# Patient Record
Sex: Male | Born: 1965 | Hispanic: No | Marital: Married | Smoking: Never smoker
Health system: Southern US, Community
[De-identification: ages and names within clinical notes are randomized; demographics above are authoritative.]

## PROBLEM LIST (undated history)

## (undated) DIAGNOSIS — E119 Type 2 diabetes mellitus without complications: Secondary | ICD-10-CM

## (undated) DIAGNOSIS — E785 Hyperlipidemia, unspecified: Secondary | ICD-10-CM

## (undated) HISTORY — DX: Type 2 diabetes mellitus without complications: E11.9

## (undated) HISTORY — DX: Hyperlipidemia, unspecified: E78.5

## (undated) HISTORY — PX: CHOLECYSTECTOMY: SHX55

---

## 1999-09-15 ENCOUNTER — Encounter: Admission: RE | Admit: 1999-09-15 | Discharge: 1999-09-15 | Payer: Self-pay | Admitting: Family Medicine

## 1999-09-23 ENCOUNTER — Encounter: Admission: RE | Admit: 1999-09-23 | Discharge: 1999-09-23 | Payer: Self-pay | Admitting: Sports Medicine

## 2003-10-10 ENCOUNTER — Ambulatory Visit (HOSPITAL_COMMUNITY): Admission: RE | Admit: 2003-10-10 | Discharge: 2003-10-10 | Payer: Self-pay | Admitting: Gastroenterology

## 2003-12-25 ENCOUNTER — Ambulatory Visit (HOSPITAL_COMMUNITY): Admission: RE | Admit: 2003-12-25 | Discharge: 2003-12-25 | Payer: Self-pay | Admitting: Gastroenterology

## 2013-12-27 ENCOUNTER — Ambulatory Visit (INDEPENDENT_AMBULATORY_CARE_PROVIDER_SITE_OTHER): Payer: BC Managed Care – PPO | Admitting: Family Medicine

## 2013-12-27 ENCOUNTER — Ambulatory Visit: Payer: BC Managed Care – PPO

## 2013-12-27 VITALS — BP 134/84 | HR 85 | Temp 98.7°F | Resp 16 | Ht 67.0 in | Wt 195.0 lb

## 2013-12-27 DIAGNOSIS — S2341XA Sprain of ribs, initial encounter: Secondary | ICD-10-CM

## 2013-12-27 DIAGNOSIS — S299XXA Unspecified injury of thorax, initial encounter: Secondary | ICD-10-CM

## 2013-12-27 MED ORDER — MELOXICAM 15 MG PO TABS: 15.0000 mg | ORAL_TABLET | Freq: Every day | ORAL | Status: DC

## 2013-12-27 NOTE — Progress Notes (Signed)
   Subjective:    Patient ID: Isaac Kelly, male    DOB: 12-Nov-1965, 48 y.o.   MRN: [id number]  HPI  Patient usually goes to Urgent Care in [place]. The patient presents tonight complaining of being hit in left posterior ribs with a baseball while up at bat. Immediately following the injury, the patient reports he did not have pain. He developed a bruise, but this did not hurt until today. The pain started when he bent down to place a screw in a piece of wood today. The pain has been off and on today, lasting about 3-4 minutes. Cough causes pain. He is able to resolve pain with change in position. He has not taken any medication for pain.  PMH- elevated cholesterol, takes Crestor, aspirin. Had labs checked a couple of months ago.  PSH-fracture surgery SH-no tobacco, social ETOH, no illicit drugs  Review of Systems No hematuria, no dysuria, no urinary frequency, no fever, no disruption in sleep, no nausea, no vomiting.    Objective:   Physical Exam  Vitals reviewed. Constitutional: He is oriented to person, place, and time. He appears well-developed and well-nourished.  HENT:  Head: Normocephalic and atraumatic.  Eyes: Conjunctivae are normal. Right eye exhibits no discharge. Left eye exhibits no discharge.  Neck: Normal range of motion. Neck supple.  Cardiovascular: Normal rate, regular rhythm and normal heart sounds.   Pulmonary/Chest: Effort normal and breath sounds normal.  Abdominal: Soft. Bowel sounds are normal.  Musculoskeletal: Normal range of motion. He exhibits edema and tenderness.  Left lateral posterior ribs with 6 cm area resolving ecchymosis, slightly swollen, tender to palpation.   Neurological: He is alert and oriented to person, place, and time.  Skin: Skin is warm and dry.  Psychiatric: He has a normal mood and affect. His behavior is normal. Judgment and thought content normal.   Left Rib XRAY- [facility] reading (PRIMARY) by  Dr. Conley Rolls- no rib fracture, no  pneumothorax.      Assessment & Plan:   1. Rib injury - DG Ribs Unilateral W/Chest Left; Future - meloxicam (MOBIC) 15 MG tablet; Take 1 tablet (15 mg total) by mouth daily.  Dispense: 30 tablet; Refill: 0 -provided written and verbal instructions, including Patient Instructions  TAke prescription medication once a day for 7-10, then as needed Can use heat to painful area 2-4 times a day Return if worsening or if no improvement in 7-10 days or if fever, abdominal pain, nausea/vomiting.  Emi Belfast, FNP-BC  [facility], [facility]  12/27/2013 8:23 PM

## 2013-12-27 NOTE — Patient Instructions (Addendum)
TAke prescription medication once a day for 7-10, then as needed Can use heat to painful area 2-4 times a day Return if worsening or if no improvement in 7-10 days or if fever, abdominal pain, nausea/vomiting.  Rib Contusion A rib contusion (bruise) can occur by a blow to the chest or by a fall against a hard object. Usually these will be much better in a couple weeks. If X-rays were taken today and there are no broken bones (fractures), the diagnosis of bruising is made. However, broken ribs may not show up for several days, or may be discovered later on a routine X-ray when signs of healing show up. If this happens to you, it does not mean that something was missed on the X-ray, but simply that it did not show up on the first X-rays. Earlier diagnosis will not usually change the treatment. HOME CARE INSTRUCTIONS   Avoid strenuous activity. Be careful during activities and avoid bumping the injured ribs. Activities that pull on the injured ribs and cause pain should be avoided, if possible.  For the first day or two, an ice pack used every 20 minutes while awake may be helpful. Put ice in a plastic bag and put a towel between the bag and the skin.  Eat a normal, well-balanced diet. Drink plenty of fluids to avoid constipation.  Take deep breaths several times a day to keep lungs free of infection. Try to cough several times a day. Splint the injured area with a pillow while coughing to ease pain. Coughing can help prevent pneumonia.  Wear a rib belt or binder only if told to do so by your caregiver. If you are wearing a rib belt or binder, you must do the breathing exercises as directed by your caregiver. If not used properly, rib belts or binders restrict breathing which can lead to pneumonia.  Only take over-the-counter or prescription medicines for pain, discomfort, or fever as directed by your caregiver. SEEK MEDICAL CARE IF:   You or your child has an oral temperature above 102 F (38.9  C).  Your baby is older than 3 months with a rectal temperature of 100.5 F (38.1 C) or higher for more than 1 day.  You develop a cough, with thick or bloody sputum. SEEK IMMEDIATE MEDICAL CARE IF:   You have difficulty breathing.  You feel sick to your stomach (nausea), have vomiting or belly (abdominal) pain.  You have worsening pain, not controlled with medications, or there is a change in the location of the pain.  You develop sweating or radiation of the pain into the arms, jaw or shoulders, or become light headed or faint.  You or your child has an oral temperature above 102 F (38.9 C), not controlled by medicine.  Your or your baby is older than 3 months with a rectal temperature of 102 F (38.9 C) or higher.  Your baby is 67 months old or younger with a rectal temperature of 100.4 F (38 C) or higher. MAKE SURE YOU:   Understand these instructions.  Will watch your condition.  Will get help right away if you are not doing well or get worse. Document Released: 04/07/2001 Document Revised: 11/07/2012 Document Reviewed: 02/29/2008 [facility] Patient Information 2014 [place], [place].

## 2014-02-26 ENCOUNTER — Encounter: Payer: Self-pay | Admitting: Gastroenterology

## 2014-03-01 ENCOUNTER — Encounter: Payer: Self-pay | Admitting: Gastroenterology

## 2014-06-04 ENCOUNTER — Encounter: Payer: BC Managed Care – PPO | Attending: "Endocrinology | Admitting: *Deleted

## 2014-06-04 ENCOUNTER — Encounter: Payer: Self-pay | Admitting: *Deleted

## 2014-06-04 DIAGNOSIS — R7309 Other abnormal glucose: Secondary | ICD-10-CM | POA: Diagnosis not present

## 2014-06-04 DIAGNOSIS — Z713 Dietary counseling and surveillance: Secondary | ICD-10-CM | POA: Diagnosis not present

## 2014-06-04 DIAGNOSIS — R7303 Prediabetes: Secondary | ICD-10-CM

## 2014-06-04 NOTE — Progress Notes (Signed)
  Medical Nutrition Therapy:  Appt start time: 1630 end time:  1730.   Assessment:  Primary concerns today:  Patient is here with spouse for nutrition counseling pertaining to pre-diabetes.  They are also here with an [place] interpreter, Isaac Kelly, from [employer].  Both individuals have a family history of diabetes. Isaac Kelly states she has made dietary changes: increasing vegetables and lean proteins, and increasing physical activity.  Normal buys the groceries and does the cooking.  She states since she was diagnosed with pre-diabetes, she using leaner cooking methods, but before she fried foods. The family eats out once a week.  They eat at the kitchen table together without distractions.  Isaac Kelly states she is a fast eater, but Isaac Kelly eats a little more slowly.  She states she used to get a lot of [employer] and ate more candies.  They both have made a lot of changes to their diet and report loosing 10 pounds. They is also eating smaller portions  A1c: 5.8%  Preferred Learning Style:   No preference indicated   Learning Readiness:   Change in progress   MEDICATIONS: none   DIETARY INTAKE:  Usual eating pattern includes 3 meals and 0-2 snacks per day.  Everyday foods include starches, lean proteins, fruits and vegetables.  Avoided foods include others.    24-hr recall:  B ( AM): vegetable smoothie and coffee with honey  Snk ( AM): fruit  L ( PM): salad or fast food Snk ( PM): maybe fruit D ( PM): Salad, vegetables, chicken, fish or steak sometimes, nopales, lentils, beans, rice, 2 tortillas sometimes Snk ( PM): bowl of cereal or maybe fruit Beverages: water  Usual physical activity: works [employer]   Estimated energy needs: 2000 calories 225 g carbohydrates 150 g protein 56 g fat  Progress Towards Goal(s):  In progress.   Nutritional Diagnosis:  NB-1.1 Food and nutrition-related knowledge deficit As related to proper balance of fats, carbohydrates, and proteins.  As  evidenced by A1c indicative of pre-diabetes.    Intervention:  Discussed physiology of diabetes.  Discussed genetic predisposition/ethnicity, and history of GDM of increasing risk for diabetes.  Discussed carb counting and reading food labels.  Recommended increasing non-starchy vegetables. Explained more about diabetes test results and symptoms of hypoglycemia  Teaching Method Utilized:  Visual Auditory Hands on  Handouts given during visit include:  Spanish Living Well with Diabtes  Spanish MyPlate  Spanish meal plan card  Barriers to learning/adherence to lifestyle change: none, change in progress  Demonstrated degree of understanding via:  Teach Back   Monitoring/Evaluation:  Dietary intake, exercise, and body weight in 2 month(s).

## 2014-08-06 ENCOUNTER — Ambulatory Visit: Payer: BC Managed Care – PPO | Admitting: *Deleted

## 2019-10-13 ENCOUNTER — Ambulatory Visit: Payer: Self-pay

## 2020-10-13 ENCOUNTER — Inpatient Hospital Stay
Admission: EM | Admit: 2020-10-13 | Discharge: 2020-10-15 | DRG: 282 | Disposition: A | Discharge status: Home / Self Care | Payer: Medicaid Other | Attending: Family Medicine | Admitting: Family Medicine

## 2020-10-13 ENCOUNTER — Emergency Department: Payer: Medicaid Other

## 2020-10-13 ENCOUNTER — Other Ambulatory Visit: Payer: Self-pay

## 2020-10-13 ENCOUNTER — Encounter: Payer: Self-pay | Admitting: Emergency Medicine

## 2020-10-13 DIAGNOSIS — Z791 Long term (current) use of non-steroidal anti-inflammatories (NSAID): Secondary | ICD-10-CM | POA: Diagnosis not present

## 2020-10-13 DIAGNOSIS — Z8249 Family history of ischemic heart disease and other diseases of the circulatory system: Secondary | ICD-10-CM

## 2020-10-13 DIAGNOSIS — Z79899 Other long term (current) drug therapy: Secondary | ICD-10-CM

## 2020-10-13 DIAGNOSIS — R079 Chest pain, unspecified: Secondary | ICD-10-CM | POA: Diagnosis not present

## 2020-10-13 DIAGNOSIS — R030 Elevated blood-pressure reading, without diagnosis of hypertension: Secondary | ICD-10-CM | POA: Diagnosis present

## 2020-10-13 DIAGNOSIS — E1165 Type 2 diabetes mellitus with hyperglycemia: Secondary | ICD-10-CM | POA: Diagnosis present

## 2020-10-13 DIAGNOSIS — R7989 Other specified abnormal findings of blood chemistry: Secondary | ICD-10-CM | POA: Diagnosis not present

## 2020-10-13 DIAGNOSIS — E785 Hyperlipidemia, unspecified: Secondary | ICD-10-CM | POA: Diagnosis not present

## 2020-10-13 DIAGNOSIS — W19XXXA Unspecified fall, initial encounter: Secondary | ICD-10-CM | POA: Diagnosis not present

## 2020-10-13 DIAGNOSIS — I214 Non-ST elevation (NSTEMI) myocardial infarction: Secondary | ICD-10-CM | POA: Diagnosis not present

## 2020-10-13 DIAGNOSIS — Z20822 Contact with and (suspected) exposure to covid-19: Secondary | ICD-10-CM | POA: Diagnosis not present

## 2020-10-13 DIAGNOSIS — R350 Frequency of micturition: Secondary | ICD-10-CM | POA: Diagnosis not present

## 2020-10-13 DIAGNOSIS — L237 Allergic contact dermatitis due to plants, except food: Secondary | ICD-10-CM | POA: Diagnosis present

## 2020-10-13 DIAGNOSIS — T380X5A Adverse effect of glucocorticoids and synthetic analogues, initial encounter: Secondary | ICD-10-CM | POA: Diagnosis not present

## 2020-10-13 DIAGNOSIS — R0789 Other chest pain: Secondary | ICD-10-CM | POA: Diagnosis not present

## 2020-10-13 DIAGNOSIS — Z7982 Long term (current) use of aspirin: Secondary | ICD-10-CM | POA: Diagnosis not present

## 2020-10-13 DIAGNOSIS — I10 Essential (primary) hypertension: Secondary | ICD-10-CM | POA: Diagnosis not present

## 2020-10-13 DIAGNOSIS — Z833 Family history of diabetes mellitus: Secondary | ICD-10-CM

## 2020-10-13 LAB — TROPONIN I (HIGH SENSITIVITY)
Troponin I (High Sensitivity): 311 ng/L (ref <18)
Troponin I (High Sensitivity): 2060 ng/L (ref <18)
Troponin I (High Sensitivity): 8509 ng/L (ref <18)

## 2020-10-13 LAB — PROTIME-INR
INR: 1.1 (ref 0.8–1.2)
Prothrombin Time: 13.4 seconds (ref 11.4–15.2)

## 2020-10-13 LAB — BASIC METABOLIC PANEL
Anion gap: 11 (ref 5–15)
BUN: 17 mg/dL (ref 6–20)
CO2: 23 mmol/L (ref 22–32)
Calcium: 9.2 mg/dL (ref 8.9–10.3)
Chloride: 101 mmol/L (ref 98–111)
Creatinine, Ser: 0.95 mg/dL (ref 0.61–1.24)
GFR, Estimated: >60 mL/min (ref >60)
Glucose, Bld: 142 mg/dL — ABNORMAL HIGH (ref 70–99)
Potassium: 4 mmol/L (ref 3.5–5.1)
Sodium: 135 mmol/L (ref 135–145)

## 2020-10-13 LAB — LIPID PANEL
Cholesterol: 201 mg/dL — ABNORMAL HIGH (ref 0–200)
HDL: 64 mg/dL (ref >40)
LDL Cholesterol: 119 mg/dL — ABNORMAL HIGH (ref 0–99)
Total CHOL/HDL Ratio: 3.1 RATIO
Triglycerides: 90 mg/dL (ref <150)
VLDL: 18 mg/dL (ref 0–40)

## 2020-10-13 LAB — CBC
HCT: 45.2 % (ref 39.0–52.0)
Hemoglobin: 15.3 g/dL (ref 13.0–17.0)
MCH: 29.6 pg (ref 26.0–34.0)
MCHC: 33.8 g/dL (ref 30.0–36.0)
MCV: 87.4 fL (ref 80.0–100.0)
Platelets: 271 10*3/uL (ref 150–400)
RBC: 5.17 MIL/uL (ref 4.22–5.81)
RDW: 12.4 % (ref 11.5–15.5)
WBC: 13 10*3/uL — ABNORMAL HIGH (ref 4.0–10.5)
nRBC: 0 % (ref 0.0–0.2)

## 2020-10-13 LAB — RESP PANEL BY RT-PCR (FLU A&B, COVID) ARPGX2
Influenza A by PCR: NEGATIVE
Influenza B by PCR: NEGATIVE
SARS Coronavirus 2 by RT PCR: NEGATIVE

## 2020-10-13 LAB — HEMOGLOBIN A1C
Hgb A1c MFr Bld: 5.8 % — ABNORMAL HIGH (ref 4.8–5.6)
Mean Plasma Glucose: 119.76 mg/dL

## 2020-10-13 LAB — CBG MONITORING, ED: Glucose-Capillary: 104 mg/dL — ABNORMAL HIGH (ref 70–99)

## 2020-10-13 LAB — APTT: aPTT: 27 seconds (ref 24–36)

## 2020-10-13 MED ORDER — MORPHINE SULFATE (PF) 2 MG/ML IV SOLN: 2.0000 mg | INTRAVENOUS | Status: DC | PRN

## 2020-10-13 MED ORDER — HYDROMORPHONE HCL 1 MG/ML IJ SOLN
0.5000 mg | Freq: Once | INTRAMUSCULAR | Status: AC
Start: 2020-10-13 — End: 2020-10-13
  Administered 2020-10-13: 0.5 mg via INTRAVENOUS
  Filled 2020-10-13: qty 1

## 2020-10-13 MED ORDER — OMEGA-3-ACID ETHYL ESTERS 1 G PO CAPS
1.0000 g | ORAL_CAPSULE | Freq: Every day | ORAL | Status: DC
  Administered 2020-10-15: 1 g via ORAL
  Filled 2020-10-13 (×2): qty 1

## 2020-10-13 MED ORDER — SODIUM CHLORIDE 0.9 % IV SOLN: INTRAVENOUS | Status: DC

## 2020-10-13 MED ORDER — NITROGLYCERIN 0.4 MG SL SUBL
0.4000 mg | SUBLINGUAL_TABLET | SUBLINGUAL | Status: DC | PRN
  Administered 2020-10-13: 0.4 mg via SUBLINGUAL
  Filled 2020-10-13: qty 1

## 2020-10-13 MED ORDER — ASPIRIN 81 MG PO CHEW
324.0000 mg | CHEWABLE_TABLET | Freq: Once | ORAL | Status: AC
  Administered 2020-10-13: 324 mg via ORAL
  Filled 2020-10-13: qty 4

## 2020-10-13 MED ORDER — HEPARIN BOLUS VIA INFUSION
4000.0000 [IU] | Freq: Once | INTRAVENOUS | Status: AC
  Administered 2020-10-13: 4000 [IU] via INTRAVENOUS
  Filled 2020-10-13: qty 4000

## 2020-10-13 MED ORDER — HEPARIN (PORCINE) 25000 UT/250ML-% IV SOLN
1450.0000 [IU]/h | INTRAVENOUS | Status: DC
  Administered 2020-10-13: 1100 [IU]/h via INTRAVENOUS
  Filled 2020-10-13 (×2): qty 250

## 2020-10-13 MED ORDER — IOHEXOL 350 MG/ML SOLN
100.0000 mL | Freq: Once | INTRAVENOUS | Status: AC | PRN
  Administered 2020-10-13: 100 mL via INTRAVENOUS

## 2020-10-13 MED ORDER — ASPIRIN EC 81 MG PO TBEC
81.0000 mg | DELAYED_RELEASE_TABLET | Freq: Every day | ORAL | Status: DC
  Administered 2020-10-15: 81 mg via ORAL
  Filled 2020-10-13 (×2): qty 1

## 2020-10-13 MED ORDER — PRAVASTATIN SODIUM 20 MG PO TABS
20.0000 mg | ORAL_TABLET | Freq: Every evening | ORAL | Status: DC
  Administered 2020-10-13 – 2020-10-14 (×2): 20 mg via ORAL
  Filled 2020-10-13 (×3): qty 1

## 2020-10-13 MED ORDER — INSULIN ASPART 100 UNIT/ML ~~LOC~~ SOLN: 0.0000 [IU] | SUBCUTANEOUS | Status: DC

## 2020-10-13 MED ORDER — METOPROLOL TARTRATE 5 MG/5ML IV SOLN: 2.5000 mg | INTRAVENOUS | Status: DC | PRN

## 2020-10-13 MED ORDER — FENOFIBRATE 160 MG PO TABS
160.0000 mg | ORAL_TABLET | Freq: Every day | ORAL | Status: DC
  Administered 2020-10-13 – 2020-10-15 (×2): 160 mg via ORAL
  Filled 2020-10-13 (×3): qty 1

## 2020-10-13 NOTE — ED Triage Notes (Signed)
Patient presents to the ED with mid-sternal chest pain radiating into his back.  Patient states pain started approx. 30-45 min ago while he was playing baseball, running the bases.  Patient appears uncomfortable in triage.  Patient states he had a similar episode of chest pain approx. 2 weeks ago but did not see a physician.

## 2020-10-13 NOTE — H&P (Signed)
History and Physical  Isaac Kelly LKG:[id number] DOB: 19-Aug-1965 DOA: 10/13/2020  Referring physician: Dr. Katrinka Blazing, EDP PCP: Jeannie Done, MD  Outpatient Specialists: None Patient coming from: Home  Chief Complaint: Chest pain  HPI: Isaac Kelly is a 55 y.o. male with medical history significant for hyperlipidemia, recently diagnosed poison ivy on prednisone, who presented from home with complaints of recurrent severe chest pain.  He describes it as chest tightness on the left side of his chest, radiating to the middle of his back.  First onset was about a week ago.  It woke him out of his sleep, lasted for about 30 minutes then resolved after his wife rubbed his back with "a cream".  This afternoon while he was playing baseball he noted the same chest tightness on the left side of his chest radiating to the middle of his back.  One of his friends gave him some medication on the field to help with the pain but it persisted.  Denies dyspnea.  Admits to fear of taking breaths due to his chest pain.  Denies family history of early coronary artery disease.  EMS was called and he received sublingual nitroglycerin and a full dose of aspirin.  He was brought to the ED for further evaluation.  Work-up in the ED revealed NSTEMI with elevated troponin greater than 2000 and no evidence of acute ischemia on twelve-lead EKG.  Was started on heparin drip.  ED Course:  Afebrile, BP 146/96, pulse 67, respiratory rate 18, O2 saturation 98% on room air.  Lab studies remarkable for troponin greater than 2000 from 311.  Nonfasting LDL 119.  WBC 13.0K, hemoglobin 15.3K, platelet count 271K.  Review of Systems: Review of systems as noted in the HPI. All other systems reviewed and are negative.   Past Medical History:  Diagnosis Date  . Diabetes mellitus without complication (HCC)   . Hyperlipidemia    Past Surgical History:  Procedure Laterality Date  . CHOLECYSTECTOMY      Social History:  reports  that he has never smoked. He has never used smokeless tobacco. No history on file for alcohol use and drug use.   No Known Allergies  Family History  Problem Relation Age of Onset  . Diabetes Other     Monitor with diabetes and hypertension. Father with diabetes and hypertension.  Prior to Admission medications   Medication Sig Start Date End Date Taking? Authorizing Provider  fenofibrate (TRICOR) 145 MG tablet Take 145 mg by mouth every evening.   Yes [provider]  Omega 3 1000 MG CAPS Take 1 capsule by mouth daily.   Yes [provider]  pravastatin (PRAVACHOL) 20 MG tablet Take 20 mg by mouth every evening.   Yes [provider]  predniSONE (DELTASONE) 10 MG tablet Take 10-30 mg by mouth See admin instructions. Take 3 tablets (30mg ) by mouth daily for three days, take 2 tablets (20mg ) by mouth daily for 3 days then take 1 tablet (10mg ) by mouth daily for five days 10/08/20  Yes [provider]    Physical Exam: BP (!) 144/74 (BP Location: Right Arm)   Pulse 71   Temp 98.2 F (36.8 C) (Oral)   Resp 18   Ht 5\' 7"  (1.702 m)   Wt 85.3 kg   SpO2 98%   BMI 29.44 kg/m   . General: 55 y.o. year-old male well developed well nourished in no acute distress.  Alert and oriented x3. . Cardiovascular: Regular rate and rhythm with  no rubs or gallops.  No thyromegaly or JVD noted.  No lower extremity edema. 2/4 pulses in all 4 extremities. Marland Kitchen Respiratory: Clear to auscultation with no wheezes or rales. Good inspiratory effort. . Abdomen: Soft nontender nondistended with normal bowel sounds x4 quadrants. . Muskuloskeletal: No cyanosis, clubbing or edema noted bilaterally . Neuro: CN II-XII intact, strength, sensation, reflexes . Skin: No ulcerative lesions noted or rashes . Psychiatry: Judgement and insight appear normal. Mood is appropriate for condition and setting          Labs on Admission:  Basic Metabolic Panel: Recent Labs  Lab  10/13/20 1628  NA 135  K 4.0  CL 101  CO2 23  GLUCOSE 142*  BUN 17  CREATININE 0.95  CALCIUM 9.2   Liver Function Tests: No results for input(s): AST, ALT, ALKPHOS, BILITOT, PROT, ALBUMIN in the last 168 hours. No results for input(s): LIPASE, AMYLASE in the last 168 hours. No results for input(s): AMMONIA in the last 168 hours. CBC: Recent Labs  Lab 10/13/20 1628  WBC 13.0*  HGB 15.3  HCT 45.2  MCV 87.4  PLT 271   Cardiac Enzymes: No results for input(s): CKTOTAL, CKMB, CKMBINDEX, TROPONINI in the last 168 hours.  BNP (last 3 results) No results for input(s): BNP in the last 8760 hours.  ProBNP (last 3 results) No results for input(s): PROBNP in the last 8760 hours.  CBG: No results for input(s): GLUCAP in the last 168 hours.  Radiological Exams on Admission: DG Chest 2 View  Result Date: 10/13/2020 CLINICAL DATA:  Patient presents to the ED with mid-sternal chest pain radiating into his back. EXAM: CHEST - 2 VIEW COMPARISON:  Chest radiograph 12/27/2013 FINDINGS: The cardiomediastinal contours are within normal limits. The lungs are clear. No pneumothorax or pleural effusion. No acute finding in the visualized skeleton. IMPRESSION: No acute cardiopulmonary process. Electronically Signed   By: Emmaline Kluver M.D.   On: 10/13/2020 17:03   CT Angio Chest/Abd/Pel for Dissection W and/or Wo Contrast  Result Date: 10/13/2020 CLINICAL DATA:  Abdominal pain. Aortic dissection suspected. mid-sternal chest pain radiating into his back. EXAM: CT ANGIOGRAPHY CHEST, ABDOMEN AND PELVIS TECHNIQUE: Non-contrast CT of the chest was initially obtained. Multidetector CT imaging through the chest, abdomen and pelvis was performed using the standard protocol during bolus administration of intravenous contrast. Multiplanar reconstructed images and MIPs were obtained and reviewed to evaluate the vascular anatomy. CONTRAST:  OMNIPAQUE IOHEXOL 350 MG/ML SOLN COMPARISON:  CT abdomen  pelvis 09/19/2019 FINDINGS: CTA CHEST FINDINGS Cardiovascular: Preferential opacification of the thoracic aorta. No evidence of thoracic aortic aneurysm or dissection. Marked eccentric noncalcified atherosclerotic plaque measuring up to 8 mm on sagittal view (9:113) along the distal aortic arch/proximal descending thoracic aorta. Anormal heart size. No significant pericardial effusion. No coronary artery calcifications. The main pulmonary artery is normal in caliber. No central pulmonary embolus. Mediastinum/Nodes: No enlarged mediastinal, hilar, or axillary lymph nodes. Thyroid gland, trachea, and esophagus demonstrate no significant findings. Lungs/Pleura: No focal consolidation. Trace bilateral lower lobe subsegmental atelectasis. No pulmonary nodule. No pulmonary mass. No pleural effusion. No pneumothorax. Musculoskeletal: No chest wall abnormality. No suspicious lytic or blastic osseous lesions. No acute displaced fracture. CTA ABDOMEN AND PELVIS FINDINGS VASCULAR Aorta: Mild atherosclerotic plaque. Normal caliber aorta without aneurysm, dissection, vasculitis or significant stenosis. Celiac: Patent without evidence of aneurysm, dissection, vasculitis or significant stenosis. SMA: Mild atherosclerotic plaque at the origin. Patent without evidence of aneurysm, dissection, vasculitis or significant stenosis. Renals: Mild  atherosclerotic plaque at the origin. Both renal arteries are patent without evidence of aneurysm, dissection, vasculitis, fibromuscular dysplasia or significant stenosis. IMA: Mild atherosclerotic plaque. Patent without evidence of aneurysm, dissection, vasculitis or significant stenosis. Inflow: Mild atherosclerotic plaque. Patent without evidence of aneurysm, dissection, vasculitis or significant stenosis. Veins: No obvious venous abnormality within the limitations of this arterial phase study. NON-VASCULAR Hepatobiliary: No focal liver abnormality. No gallstones, gallbladder wall thickening,  or pericholecystic fluid. No biliary dilatation. Pancreas: No focal lesion. Normal pancreatic contour. No surrounding inflammatory changes. No main pancreatic ductal dilatation. Spleen: Normal in size without focal abnormality. Adrenals/Urinary Tract: No adrenal nodule bilaterally. Bilateral kidneys enhance symmetrically. No hydronephrosis. No hydroureter. The urinary bladder is unremarkable. Stomach/Bowel: Stomach is within normal limits. No evidence of bowel wall thickening or dilatation. Appendix appears normal. Lymphatic: No lymphadenopathy. Reproductive: Prostate is unremarkable. Other: No intraperitoneal free fluid. No intraperitoneal free gas. No organized fluid collection. Musculoskeletal: No abdominal wall hernia or abnormality. No suspicious lytic or blastic osseous lesions. No acute displaced fracture. Review of the MIP images confirms the above findings. IMPRESSION: 1. No aortic dissection or aneurysm. 2. Aortic Atherosclerosis (ICD10-I70.0) with marked eccentric noncalcified atherosclerotic plaque measuring up to 8 mm along the distal aortic arch/proximal descending thoracic aorta. 3. No acute nonvascular intrathoracic, intra-abdominal, intrapelvic abnormality. Electronically Signed   By: Tish FredericksonMorgane  Naveau M.D.   On: 10/13/2020 18:25    EKG: I independently viewed the EKG done and my findings are as followed: Sinus rhythm rate of 86.  Nonspecific ST-T changes.  QTc 432.  Assessment/Plan Present on Admission: . NSTEMI (non-ST elevated myocardial infarction) [facility](HCC)  Active Problems:   NSTEMI (non-ST elevated myocardial infarction) [facility](HCC)  NSTEMI Presented with second episode of severe chest tightness today while playing baseball. First episode of chest tightness was about a week ago.  It woke him out of his sleep.  Lasted 30 minutes then resolved. Elevated troponin on presentation to the ED, up trended to greater than 1000. Received sublingual nitroglycerin and a full dose aspirin Started on  heparin drip in the ED, continue Cardiology consult 2D echo pending. Non fasting LDL elevated greater than [id number], goal less than 70. N.p.o. until seen by cardiology Start gentle IV fluid hydration normal saline at 50 cc/h, DC IV fluid if no plan for heart cath. Repeat twelve-lead EKG Closely monitor on telemetry.  Hyperlipidemia Nonfasting LDL 119 Goal LDL less than 70 Continue home fenofibrate and home Lovaza Resume home pravastatin, defer to cardiology to switch to Lipitor 80 mg daily.  Recently diagnosed poison ivy on prednisone Hold off steroid for now in the setting of NSTEMI  Hyperglycemia: Likely steroid-induced, on prednisone. No prior history of diabetes Obtain hemoglobin A1c Sensitive insulin sliding scale Avoid hypoglycemia.   DVT prophylaxis: Heparin drip.  Code Status: Full code as stated by the patient himself.  Family Communication: His wife is at bedside.  All questions answered to the best of my ability.  Disposition Plan: Admit to progressive cardiac.  Consults called: Cardiology.  Admission status: Inpatient status.  Patient will require at least 2 midnights for further evaluation and treatment of present condition.   Status is: Inpatient    Dispo:  Patient From: Home  Planned Disposition: Home  Anticipated discharge date 10/15/2020 or when cardiology signs off.  Medically stable for discharge: No, ongoing management of chest pain, NSTEMI.         Darlin Droparole N Len Kluver MD [facility] Pager [phone]  If 7PM-7AM, please contact night-coverage [email or web address] Password  [email or web address]  10/13/2020, 7:59 PM

## 2020-10-13 NOTE — Progress Notes (Signed)
ANTICOAGULATION CONSULT NOTE - Initial Consult  Pharmacy Consult for heparin Indication: chest pain/ACS  No Known Allergies  Patient Measurements: Height: 5\' 7"  (170.2 cm) Weight: 85.3 kg (188 lb) IBW/kg (Calculated) : 66.1 HEPARIN DW (KG): 83.4  Vital Signs: Temp: 98.2 F (36.8 C) (03/20 1625) Temp Source: Oral (03/20 1625) BP: 145/78 (03/20 1713) Pulse Rate: 80 (03/20 1713)  Labs: Recent Labs    10/13/20 1628  HGB 15.3  HCT 45.2  PLT 271  CREATININE 0.95  TROPONINIHS 311*    Estimated Creatinine Clearance: 91.7 mL/min (by C-G formula based on SCr of 0.95 mg/dL).   Medical History: Past Medical History:  Diagnosis Date  . Diabetes mellitus without complication (HCC)   . Hyperlipidemia     Assessment: 55 y.o. male presenting with chest pain and found to have elevated troponin, ACS. Pharmacy has been consulted for IV heparin dosing.   Hgb and platelets WNL. No overt bleeding noted. No prior to admission anticoagulation noted. Baseline PT/INR, CBC, and aPTT ordered.  Goal of Therapy:  Heparin level 0.3-0.7 units/ml Monitor platelets by anticoagulation protocol: Yes  Plan:  Heparin bolus 4000 units IV x 1 Then begin heparin infusion at 1100 units/hr Check heparin level 6 hours after start of infusion Monitor CBC, daily heparin level  Continue to monitor for signs/symptoms of bleeding   53, PharmD Clinical Pharmacist  10/13/2020   5:13 PM

## 2020-10-13 NOTE — ED Provider Notes (Signed)
[facility] Emergency Department Provider Note  ____________________________________________   Event Date/Time   First MD Initiated Contact with Patient 10/13/20 1652     (approximate)  I have reviewed the triage vital signs and the nursing notes.   HISTORY  Chief Complaint Chest Pain   HPI Isaac Kelly is a 55 y.o. male with past medical history of HDL and DM who presents for assessment of chest pain.  Patient states he was playing baseball when it began approximately 30 to 45 minutes prior to arrival he was running and felt sudden onset of pain in his left chest rating to his mid back.  He states it is currently 10/6 in intensity.  He states it waxes and wanes.  Describes it as sharp.  Does not radiate to his arms or jaw.  He denies any other sick symptoms including headache, earache, sore throat, nausea, vomiting, diarrhea, dysuria, cough, shortness of breath, abdominal pain, lower back pain, extremity pain, rash or any recent injuries or falls.  Denies being on any blood thinners.  Denies tobacco use, EtOH use on a regular basis or any illicit drug use.  No prior similar episodes.  No clearly fitting aggravating factors.  He has not taken any medications prior to arrival.        Past Medical History:  Diagnosis Date  . Diabetes mellitus without complication (HCC)   . Hyperlipidemia     There are no problems to display for this patient.   Past Surgical History:  Procedure Laterality Date  . CHOLECYSTECTOMY      Prior to Admission medications   Medication Sig Start Date End Date Taking? Authorizing Provider  aspirin 81 MG tablet Take 81 mg by mouth daily.    [provider]  meloxicam (MOBIC) 15 MG tablet Take 1 tablet (15 mg total) by mouth daily. 12/27/13   Emi Belfast, FNP  rosuvastatin (CRESTOR) 20 MG tablet Take 20 mg by mouth daily.    [provider]    Allergies Patient has no known allergies.  Family History   Problem Relation Age of Onset  . Diabetes Other     Social History Social History   Tobacco Use  . Smoking status: Never Smoker  . Smokeless tobacco: Never Used    Review of Systems  Review of Systems  Constitutional: Negative for chills and fever.  HENT: Negative for sore throat.   Eyes: Negative for pain.  Respiratory: Negative for cough and stridor.   Cardiovascular: Positive for chest pain.  Gastrointestinal: Negative for vomiting.  Genitourinary: Negative for dysuria.  Musculoskeletal: Negative for myalgias.  Skin: Negative for rash.  Neurological: Negative for seizures, loss of consciousness and headaches.  Psychiatric/Behavioral: Negative for suicidal ideas.  All other systems reviewed and are negative.     ____________________________________________   PHYSICAL EXAM:  VITAL SIGNS: ED Triage Vitals  Enc Vitals Group     BP --      Pulse --      Resp 10/13/20 1625 18     Temp 10/13/20 1625 98.2 F (36.8 C)     Temp Source 10/13/20 1625 Oral     SpO2 --      Weight 10/13/20 1626 188 lb (85.3 kg)     Height 10/13/20 1626 5\' 7"  (1.702 m)     Head Circumference --      Peak Flow --      Pain Score 10/13/20 1625 5     Pain Loc --  Pain Edu? --      Excl. in GC? --    Vitals:   10/13/20 1625 10/13/20 1713  BP:  (!) 145/78  Pulse:  80  Resp: 18 15  Temp: 98.2 F (36.8 C)   SpO2:  97%   Physical Exam Vitals and nursing note reviewed.  Constitutional:      Appearance: He is well-developed.  HENT:     Head: Normocephalic and atraumatic.     Right Ear: External ear normal.     Left Ear: External ear normal.     Nose: Nose normal.  Eyes:     Conjunctiva/sclera: Conjunctivae normal.  Cardiovascular:     Rate and Rhythm: Normal rate and regular rhythm.     Heart sounds: No murmur heard.   Pulmonary:     Effort: Pulmonary effort is normal. No respiratory distress.     Breath sounds: Normal breath sounds.  Abdominal:     Palpations:  Abdomen is soft.     Tenderness: There is no abdominal tenderness.  Musculoskeletal:     Cervical back: Neck supple.  Skin:    General: Skin is warm and dry.     Capillary Refill: Capillary refill takes less than 2 seconds.  Neurological:     Mental Status: He is alert and oriented to person, place, and time.  Psychiatric:        Mood and Affect: Mood normal.      ____________________________________________   LABS (all labs ordered are listed, but only abnormal results are displayed)  Labs Reviewed  BASIC METABOLIC PANEL - Abnormal; Notable for the following components:      Result Value   Glucose, Bld 142 (*)    All other components within normal limits  CBC - Abnormal; Notable for the following components:   WBC 13.0 (*)    All other components within normal limits  TROPONIN I (HIGH SENSITIVITY) - Abnormal; Notable for the following components:   Troponin I (High Sensitivity) 311 (*)    All other components within normal limits  RESP PANEL BY RT-PCR (FLU A&B, COVID) ARPGX2  HEMOGLOBIN A1C  LIPID PANEL  TROPONIN I (HIGH SENSITIVITY)   ____________________________________________  EKG  Sinus rhythm with ventricular 75, normal axis, unremarkable intervals and possible hyperacute T waves in V2 without other clear evidence of acute ischemia or other significant underlying arrhythmia. ____________________________________________  RADIOLOGY  ED MD interpretation: Chest x-ray is unremarkable.  CTA dissection study shows no evidence of dissection or other clear acute intrathoracic process.  There is evidence of aortic atherosclerosis.  Official radiology report(s): DG Chest 2 View  Result Date: 10/13/2020 CLINICAL DATA:  Patient presents to the ED with mid-sternal chest pain radiating into his back. EXAM: CHEST - 2 VIEW COMPARISON:  Chest radiograph 12/27/2013 FINDINGS: The cardiomediastinal contours are within normal limits. The lungs are clear. No pneumothorax or pleural  effusion. No acute finding in the visualized skeleton. IMPRESSION: No acute cardiopulmonary process. Electronically Signed   By: Emmaline KluverNancy  Ballantyne M.D.   On: 10/13/2020 17:03   CT Angio Chest/Abd/Pel for Dissection W and/or Wo Contrast  Result Date: 10/13/2020 CLINICAL DATA:  Abdominal pain. Aortic dissection suspected. mid-sternal chest pain radiating into his back. EXAM: CT ANGIOGRAPHY CHEST, ABDOMEN AND PELVIS TECHNIQUE: Non-contrast CT of the chest was initially obtained. Multidetector CT imaging through the chest, abdomen and pelvis was performed using the standard protocol during bolus administration of intravenous contrast. Multiplanar reconstructed images and MIPs were obtained and reviewed to evaluate the  vascular anatomy. CONTRAST:  OMNIPAQUE IOHEXOL 350 MG/ML SOLN COMPARISON:  CT abdomen pelvis 09/19/2019 FINDINGS: CTA CHEST FINDINGS Cardiovascular: Preferential opacification of the thoracic aorta. No evidence of thoracic aortic aneurysm or dissection. Marked eccentric noncalcified atherosclerotic plaque measuring up to 8 mm on sagittal view (9:113) along the distal aortic arch/proximal descending thoracic aorta. Anormal heart size. No significant pericardial effusion. No coronary artery calcifications. The main pulmonary artery is normal in caliber. No central pulmonary embolus. Mediastinum/Nodes: No enlarged mediastinal, hilar, or axillary lymph nodes. Thyroid gland, trachea, and esophagus demonstrate no significant findings. Lungs/Pleura: No focal consolidation. Trace bilateral lower lobe subsegmental atelectasis. No pulmonary nodule. No pulmonary mass. No pleural effusion. No pneumothorax. Musculoskeletal: No chest wall abnormality. No suspicious lytic or blastic osseous lesions. No acute displaced fracture. CTA ABDOMEN AND PELVIS FINDINGS VASCULAR Aorta: Mild atherosclerotic plaque. Normal caliber aorta without aneurysm, dissection, vasculitis or significant stenosis. Celiac: Patent without  evidence of aneurysm, dissection, vasculitis or significant stenosis. SMA: Mild atherosclerotic plaque at the origin. Patent without evidence of aneurysm, dissection, vasculitis or significant stenosis. Renals: Mild atherosclerotic plaque at the origin. Both renal arteries are patent without evidence of aneurysm, dissection, vasculitis, fibromuscular dysplasia or significant stenosis. IMA: Mild atherosclerotic plaque. Patent without evidence of aneurysm, dissection, vasculitis or significant stenosis. Inflow: Mild atherosclerotic plaque. Patent without evidence of aneurysm, dissection, vasculitis or significant stenosis. Veins: No obvious venous abnormality within the limitations of this arterial phase study. NON-VASCULAR Hepatobiliary: No focal liver abnormality. No gallstones, gallbladder wall thickening, or pericholecystic fluid. No biliary dilatation. Pancreas: No focal lesion. Normal pancreatic contour. No surrounding inflammatory changes. No main pancreatic ductal dilatation. Spleen: Normal in size without focal abnormality. Adrenals/Urinary Tract: No adrenal nodule bilaterally. Bilateral kidneys enhance symmetrically. No hydronephrosis. No hydroureter. The urinary bladder is unremarkable. Stomach/Bowel: Stomach is within normal limits. No evidence of bowel wall thickening or dilatation. Appendix appears normal. Lymphatic: No lymphadenopathy. Reproductive: Prostate is unremarkable. Other: No intraperitoneal free fluid. No intraperitoneal free gas. No organized fluid collection. Musculoskeletal: No abdominal wall hernia or abnormality. No suspicious lytic or blastic osseous lesions. No acute displaced fracture. Review of the MIP images confirms the above findings. IMPRESSION: 1. No aortic dissection or aneurysm. 2. Aortic Atherosclerosis (ICD10-I70.0) with marked eccentric noncalcified atherosclerotic plaque measuring up to 8 mm along the distal aortic arch/proximal descending thoracic aorta. 3. No acute  nonvascular intrathoracic, intra-abdominal, intrapelvic abnormality. Electronically Signed   By: Tish Frederickson M.D.   On: 10/13/2020 18:25    ____________________________________________   PROCEDURES  Procedure(s) performed (including Critical Care):  .Critical Care Performed by: Gilles Chiquito, MD Authorized by: Gilles Chiquito, MD   Critical care provider statement:    Critical care time (minutes):  45   Critical care time was exclusive of:  Separately billable procedures and treating other patients   Critical care was necessary to treat or prevent imminent or life-threatening deterioration of the following conditions:  Cardiac failure   Critical care was time spent personally by me on the following activities:  Discussions with consultants, evaluation of patient's response to treatment, examination of patient, ordering and performing treatments and interventions, ordering and review of laboratory studies, ordering and review of radiographic studies, pulse oximetry, re-evaluation of patient's condition, obtaining history from patient or surrogate and review of old charts     ____________________________________________   INITIAL IMPRESSION / ASSESSMENT AND PLAN / ED COURSE      Patient presents with above-stated history exam for assessment of some chest pain that  began while he was running playing baseball within an hour of arrival.  Denies any injuries or falls.  On arrival he is afebrile hemodynamically stable.  Prior differential includes ACS, MSK, dissection, spontaneous pneumothorax, pneumonia, PE, and GI etiologies.  ECG has no clear ischemia although given age and risk factors we will plan to obtain delta troponin.  CBC remarkable for WBC count of 13 and no acute anemia or abnormal platelets.  BMP shows glucose of 142 but no other significant derangements.  Chest x-ray obtained has no evidence of pneumonia or pneumothorax.  Given overall description of symptoms with  onset during exertion concern for dissection.  CTA obtained does not show evidence of dissection pericardial effusion large PE or other clear acute thoracic process.  Initial troponin is elevated to 11 and given concern for possible hyperacute T waves in V2 concerning for NSTEMI.  Patient given ASA and started on heparin.  I will plan to admit to hospital service for further evaluation and management.  He is pain-free on several reassessments.    ____________________________________________   FINAL CLINICAL IMPRESSION(S) / ED DIAGNOSES  Final diagnoses:  NSTEMI (non-ST elevated myocardial infarction) (HCC)    Medications  nitroGLYCERIN (NITROSTAT) SL tablet 0.4 mg (0.4 mg Sublingual Given 10/13/20 1839)  insulin aspart (novoLOG) injection 0-15 Units (has no administration in time range)  HYDROmorphone (DILAUDID) injection 0.5 mg (0.5 mg Intravenous Given 10/13/20 1711)  iohexol (OMNIPAQUE) 350 MG/ML injection 100 mL (100 mLs Intravenous Contrast Given 10/13/20 1743)  aspirin chewable tablet 324 mg (324 mg Oral Given 10/13/20 1839)     ED Discharge Orders    None       Note:  This document was prepared using Dragon voice recognition software and may include unintentional dictation errors.   Gilles Chiquito, MD 10/13/20 [phone]

## 2020-10-14 ENCOUNTER — Inpatient Hospital Stay
Admit: 2020-10-14 | Discharge: 2020-10-14 | Disposition: A | Discharge status: Home / Self Care | Payer: Medicaid Other | Attending: Internal Medicine | Admitting: Internal Medicine

## 2020-10-14 ENCOUNTER — Other Ambulatory Visit: Payer: Self-pay

## 2020-10-14 ENCOUNTER — Encounter
Admission: EM | Disposition: A | Discharge status: Home / Self Care | Payer: Self-pay | Source: Home / Self Care | Attending: Family Medicine

## 2020-10-14 DIAGNOSIS — I214 Non-ST elevation (NSTEMI) myocardial infarction: Secondary | ICD-10-CM

## 2020-10-14 HISTORY — PX: LEFT HEART CATH AND CORONARY ANGIOGRAPHY: CATH118249

## 2020-10-14 LAB — CBG MONITORING, ED
Glucose-Capillary: 104 mg/dL — ABNORMAL HIGH (ref 70–99)
Glucose-Capillary: 97 mg/dL (ref 70–99)
Glucose-Capillary: 96 mg/dL (ref 70–99)

## 2020-10-14 LAB — ECHOCARDIOGRAM COMPLETE
AR max vel: 2.22 cm2
AV Area VTI: 2.01 cm2
AV Area mean vel: 2.25 cm2
AV Mean grad: 5 mmHg
AV Peak grad: 9.4 mmHg
Ao pk vel: 1.53 m/s
Area-P 1/2: 4.17 cm2
Height: 67 in
MV VTI: 2.04 cm2
S' Lateral: 2.5 cm
Weight: 3008 oz

## 2020-10-14 LAB — CBC
HCT: 44.3 % (ref 39.0–52.0)
Hemoglobin: 14.9 g/dL (ref 13.0–17.0)
MCH: 29.6 pg (ref 26.0–34.0)
MCHC: 33.6 g/dL (ref 30.0–36.0)
MCV: 87.9 fL (ref 80.0–100.0)
Platelets: 240 10*3/uL (ref 150–400)
RBC: 5.04 MIL/uL (ref 4.22–5.81)
RDW: 12.6 % (ref 11.5–15.5)
WBC: 12.8 10*3/uL — ABNORMAL HIGH (ref 4.0–10.5)
nRBC: 0 % (ref 0.0–0.2)

## 2020-10-14 LAB — BASIC METABOLIC PANEL
Anion gap: 7 (ref 5–15)
BUN: 11 mg/dL (ref 6–20)
CO2: 25 mmol/L (ref 22–32)
Calcium: 8.7 mg/dL — ABNORMAL LOW (ref 8.9–10.3)
Chloride: 105 mmol/L (ref 98–111)
Creatinine, Ser: 0.78 mg/dL (ref 0.61–1.24)
GFR, Estimated: >60 mL/min (ref >60)
Glucose, Bld: 104 mg/dL — ABNORMAL HIGH (ref 70–99)
Potassium: 3.6 mmol/L (ref 3.5–5.1)
Sodium: 137 mmol/L (ref 135–145)

## 2020-10-14 LAB — BRAIN NATRIURETIC PEPTIDE: B Natriuretic Peptide: 100.5 pg/mL — ABNORMAL HIGH (ref 0.0–100.0)

## 2020-10-14 LAB — GLUCOSE, CAPILLARY
Glucose-Capillary: 132 mg/dL — ABNORMAL HIGH (ref 70–99)
Glucose-Capillary: 133 mg/dL — ABNORMAL HIGH (ref 70–99)

## 2020-10-14 LAB — HEPARIN LEVEL (UNFRACTIONATED)
Heparin Unfractionated: 0.22 IU/mL — ABNORMAL LOW (ref 0.30–0.70)
Heparin Unfractionated: 0.24 IU/mL — ABNORMAL LOW (ref 0.30–0.70)

## 2020-10-14 LAB — PHOSPHORUS: Phosphorus: 3.6 mg/dL (ref 2.5–4.6)

## 2020-10-14 LAB — MAGNESIUM: Magnesium: 2.3 mg/dL (ref 1.7–2.4)

## 2020-10-14 LAB — TROPONIN I (HIGH SENSITIVITY): Troponin I (High Sensitivity): 11658 ng/L (ref <18)

## 2020-10-14 SURGERY — LEFT HEART CATH AND CORONARY ANGIOGRAPHY
Anesthesia: Moderate Sedation

## 2020-10-14 MED ORDER — HEPARIN SODIUM (PORCINE) 1000 UNIT/ML IJ SOLN
INTRAMUSCULAR | Status: AC
  Filled 2020-10-14: qty 1

## 2020-10-14 MED ORDER — VERAPAMIL HCL 2.5 MG/ML IV SOLN
INTRAVENOUS | Status: AC
  Filled 2020-10-14: qty 2

## 2020-10-14 MED ORDER — SODIUM CHLORIDE 0.9 % WEIGHT BASED INFUSION: 3.0000 mL/kg/h | INTRAVENOUS | Status: DC

## 2020-10-14 MED ORDER — ASPIRIN 81 MG PO CHEW: 81.0000 mg | CHEWABLE_TABLET | ORAL | Status: AC

## 2020-10-14 MED ORDER — MIDAZOLAM HCL 2 MG/2ML IJ SOLN
INTRAMUSCULAR | Status: AC
  Filled 2020-10-14: qty 2

## 2020-10-14 MED ORDER — ONDANSETRON HCL 4 MG/2ML IJ SOLN: 4.0000 mg | Freq: Four times a day (QID) | INTRAMUSCULAR | Status: DC | PRN

## 2020-10-14 MED ORDER — IOHEXOL 300 MG/ML  SOLN
INTRAMUSCULAR | Status: DC | PRN
  Administered 2020-10-14: 83 mL

## 2020-10-14 MED ORDER — ATORVASTATIN CALCIUM 80 MG PO TABS: 80.0000 mg | ORAL_TABLET | Freq: Every day | ORAL | Status: DC

## 2020-10-14 MED ORDER — INSULIN ASPART 100 UNIT/ML ~~LOC~~ SOLN: 0.0000 [IU] | Freq: Three times a day (TID) | SUBCUTANEOUS | Status: DC

## 2020-10-14 MED ORDER — SODIUM CHLORIDE 0.9% FLUSH
3.0000 mL | Freq: Two times a day (BID) | INTRAVENOUS | Status: DC
  Administered 2020-10-14: 3 mL via INTRAVENOUS

## 2020-10-14 MED ORDER — FENTANYL CITRATE (PF) 100 MCG/2ML IJ SOLN
INTRAMUSCULAR | Status: DC | PRN
  Administered 2020-10-14: 25 ug via INTRAVENOUS

## 2020-10-14 MED ORDER — SODIUM CHLORIDE 0.9 % WEIGHT BASED INFUSION: 1.0000 mL/kg/h | INTRAVENOUS | Status: DC

## 2020-10-14 MED ORDER — SODIUM CHLORIDE 0.9 % WEIGHT BASED INFUSION
1.0000 mL/kg/h | INTRAVENOUS | Status: AC
  Administered 2020-10-14: 1 mL/kg/h via INTRAVENOUS

## 2020-10-14 MED ORDER — LABETALOL HCL 5 MG/ML IV SOLN: 10.0000 mg | INTRAVENOUS | Status: AC | PRN

## 2020-10-14 MED ORDER — HEPARIN (PORCINE) IN NACL 1000-0.9 UT/500ML-% IV SOLN
INTRAVENOUS | Status: AC
  Filled 2020-10-14: qty 1000

## 2020-10-14 MED ORDER — FENTANYL CITRATE (PF) 100 MCG/2ML IJ SOLN
INTRAMUSCULAR | Status: AC
  Filled 2020-10-14: qty 2

## 2020-10-14 MED ORDER — SODIUM CHLORIDE 0.9 % IV SOLN: 250.0000 mL | INTRAVENOUS | Status: DC | PRN

## 2020-10-14 MED ORDER — ASPIRIN 81 MG PO CHEW
CHEWABLE_TABLET | ORAL | Status: AC
  Administered 2020-10-14: 81 mg via ORAL
  Filled 2020-10-14: qty 1

## 2020-10-14 MED ORDER — HEPARIN BOLUS VIA INFUSION
1250.0000 [IU] | Freq: Once | INTRAVENOUS | Status: AC
  Administered 2020-10-14: 1250 [IU] via INTRAVENOUS
  Filled 2020-10-14: qty 1250

## 2020-10-14 MED ORDER — MIDAZOLAM HCL 2 MG/2ML IJ SOLN
INTRAMUSCULAR | Status: DC | PRN
  Administered 2020-10-14: 1 mg via INTRAVENOUS

## 2020-10-14 MED ORDER — SODIUM CHLORIDE 0.9% FLUSH: 3.0000 mL | INTRAVENOUS | Status: DC | PRN

## 2020-10-14 MED ORDER — PREDNISONE 10 MG PO TABS
10.0000 mg | ORAL_TABLET | Freq: Every day | ORAL | Status: DC
  Administered 2020-10-15: 10 mg via ORAL
  Filled 2020-10-14: qty 1

## 2020-10-14 MED ORDER — ACETAMINOPHEN 325 MG PO TABS: 650.0000 mg | ORAL_TABLET | ORAL | Status: DC | PRN

## 2020-10-14 MED ORDER — HYDRALAZINE HCL 20 MG/ML IJ SOLN: 10.0000 mg | INTRAMUSCULAR | Status: AC | PRN

## 2020-10-14 MED ORDER — VERAPAMIL HCL 2.5 MG/ML IV SOLN
INTRAVENOUS | Status: DC | PRN
  Administered 2020-10-14: 2.5 mg via INTRA_ARTERIAL

## 2020-10-14 MED ORDER — HEPARIN SODIUM (PORCINE) 1000 UNIT/ML IJ SOLN
INTRAMUSCULAR | Status: DC | PRN
  Administered 2020-10-14: 4000 [IU] via INTRAVENOUS

## 2020-10-14 MED ORDER — HEPARIN (PORCINE) IN NACL 2000-0.9 UNIT/L-% IV SOLN
INTRAVENOUS | Status: DC | PRN
  Administered 2020-10-14: 1000 mL

## 2020-10-14 SURGICAL SUPPLY — 11 items
CATH INFINITI 5 FR JL3.5 (CATHETERS) ×1 IMPLANT
CATH INFINITI JR4 5F (CATHETERS) ×1 IMPLANT
DEVICE RAD TR BAND REGULAR (VASCULAR PRODUCTS) ×1 IMPLANT
DRAPE BRACHIAL (DRAPES) ×1 IMPLANT
GLIDESHEATH SLEND SS 6F .021 (SHEATH) ×1 IMPLANT
GUIDEWIRE INQWIRE 1.5J.035X260 (WIRE) IMPLANT
INQWIRE 1.5J .035X260CM (WIRE) ×2
PACK CARDIAC CATH (CUSTOM PROCEDURE TRAY) ×2 IMPLANT
PROTECTION STATION PRESSURIZED (MISCELLANEOUS) ×2
SET ATX SIMPLICITY (MISCELLANEOUS) ×1 IMPLANT
STATION PROTECTION PRESSURIZED (MISCELLANEOUS) IMPLANT

## 2020-10-14 NOTE — ED Notes (Signed)
Report given to Brianna, RN

## 2020-10-14 NOTE — Consult Note (Signed)
[facility] Cardiology Consultation Note  Patient ID: Isaac Kelly, MRN: [id number], DOB/AGE: [birth year] 55 y.o. Admit date: 10/13/2020   Date of Consult: 10/14/2020 Primary Physician: Jeannie Done, MD Primary Cardiologist: None  Chief Complaint:  Chief Complaint  Patient presents with  . Chest Pain   Reason for Consult: Non-ST elevation myocardial infarction  HPI: 55 y.o. male with known hyperlipidemia and borderline hypertension with family history of cardiovascular disease previously on antilipid medication management.  He ended up having an episode of severe substernal chest discomfort when doing activities on Sundays.  This had lasted for several hours when he came to the hospital.  At that time his EKG had shown that he had normal sinus rhythm.  Chest x-ray was normal but elevated troponin of 311.  Now troponin has increased to 11 650 and consistent with non-ST elevation myocardial infarction.  He was placed on heparin and aspirin and has had no evidence of chest discomfort or other symptoms since that time.  The patient additionally has been hemodynamically stable  Past Medical History:  Diagnosis Date  . Diabetes mellitus without complication (HCC)   . Hyperlipidemia       Surgical History:  Past Surgical History:  Procedure Laterality Date  . CHOLECYSTECTOMY       Home Meds: Prior to Admission medications   Medication Sig Start Date End Date Taking? Authorizing Provider  fenofibrate (TRICOR) 145 MG tablet Take 145 mg by mouth every evening.   Yes [provider]  Omega 3 1000 MG CAPS Take 1 capsule by mouth daily.   Yes [provider]  pravastatin (PRAVACHOL) 20 MG tablet Take 20 mg by mouth every evening.   Yes [provider]  predniSONE (DELTASONE) 10 MG tablet Take 10-30 mg by mouth See admin instructions. Take 3 tablets (30mg ) by mouth daily for three days, take 2 tablets (20mg ) by mouth daily for 3 days then take 1 tablet (10mg )  by mouth daily for five days 10/08/20  Yes [provider]    Inpatient Medications:  . aspirin EC  81 mg Oral Daily  . fenofibrate  160 mg Oral Daily  . insulin aspart  0-15 Units Subcutaneous Q4H  . omega-3 acid ethyl esters  1 g Oral Daily  . pravastatin  20 mg Oral QPM   . sodium chloride 50 mL/hr at 10/13/20 2055  . heparin 1,250 Units/hr (10/14/20 0358)    Allergies: No Known Allergies  Social History   Socioeconomic History  . Marital status: Married    Spouse name: Not on file  . Number of children: Not on file  . Years of education: Not on file  . Highest education level: Not on file  Occupational History  . Not on file  Tobacco Use  . Smoking status: Never Smoker  . Smokeless tobacco: Never Used  Substance and Sexual Activity  . Alcohol use: Not on file  . Drug use: Not on file  . Sexual activity: Not on file  Other Topics Concern  . Not on file  Social History Narrative  . Not on file   Social Determinants of Health   Financial Resource Strain: Not on file  Food Insecurity: Not on file  Transportation Needs: Not on file  Physical Activity: Not on file  Stress: Not on file  Social Connections: Not on file  Intimate Partner Violence: Not on file     Family History  Problem Relation Age of Onset  . Diabetes Other  Review of Systems Positive for chest pain Negative for: General:  chills, fever, night sweats or weight changes.  Cardiovascular: PND orthopnea syncope dizziness  Dermatological skin lesions rashes Respiratory: Cough congestion Urologic: Frequent urination urination at night and hematuria Abdominal: negative for nausea, vomiting, diarrhea, bright red blood per rectum, melena, or hematemesis Neurologic: negative for visual changes, and/or hearing changes  All other systems reviewed and are otherwise negative except as noted above.  Labs: No results for input(s): CKTOTAL, CKMB, TROPONINI in the last 72 hours. Lab Results   Component Value Date   WBC 12.8 (H) 10/14/2020   HGB 14.9 10/14/2020   HCT 44.3 10/14/2020   MCV 87.9 10/14/2020   PLT 240 10/14/2020    Recent Labs  Lab 10/14/20 0356  NA 137  K 3.6  CL 105  CO2 25  BUN 11  CREATININE 0.78  CALCIUM 8.7*  GLUCOSE 104*   Lab Results  Component Value Date   CHOL 201 (H) 10/13/2020   HDL 64 10/13/2020   LDLCALC 119 (H) 10/13/2020   TRIG 90 10/13/2020   No results found for: DDIMER  Radiology/Studies:  DG Chest 2 View  Result Date: 10/13/2020 CLINICAL DATA:  Patient presents to the ED with mid-sternal chest pain radiating into his back. EXAM: CHEST - 2 VIEW COMPARISON:  Chest radiograph 12/27/2013 FINDINGS: The cardiomediastinal contours are within normal limits. The lungs are clear. No pneumothorax or pleural effusion. No acute finding in the visualized skeleton. IMPRESSION: No acute cardiopulmonary process. Electronically Signed   By: Emmaline Kluver M.D.   On: 10/13/2020 17:03   CT Angio Chest/Abd/Pel for Dissection W and/or Wo Contrast  Result Date: 10/13/2020 CLINICAL DATA:  Abdominal pain. Aortic dissection suspected. mid-sternal chest pain radiating into his back. EXAM: CT ANGIOGRAPHY CHEST, ABDOMEN AND PELVIS TECHNIQUE: Non-contrast CT of the chest was initially obtained. Multidetector CT imaging through the chest, abdomen and pelvis was performed using the standard protocol during bolus administration of intravenous contrast. Multiplanar reconstructed images and MIPs were obtained and reviewed to evaluate the vascular anatomy. CONTRAST:  OMNIPAQUE IOHEXOL 350 MG/ML SOLN COMPARISON:  CT abdomen pelvis 09/19/2019 FINDINGS: CTA CHEST FINDINGS Cardiovascular: Preferential opacification of the thoracic aorta. No evidence of thoracic aortic aneurysm or dissection. Marked eccentric noncalcified atherosclerotic plaque measuring up to 8 mm on sagittal view (9:113) along the distal aortic arch/proximal descending thoracic aorta. Anormal heart  size. No significant pericardial effusion. No coronary artery calcifications. The main pulmonary artery is normal in caliber. No central pulmonary embolus. Mediastinum/Nodes: No enlarged mediastinal, hilar, or axillary lymph nodes. Thyroid gland, trachea, and esophagus demonstrate no significant findings. Lungs/Pleura: No focal consolidation. Trace bilateral lower lobe subsegmental atelectasis. No pulmonary nodule. No pulmonary mass. No pleural effusion. No pneumothorax. Musculoskeletal: No chest wall abnormality. No suspicious lytic or blastic osseous lesions. No acute displaced fracture. CTA ABDOMEN AND PELVIS FINDINGS VASCULAR Aorta: Mild atherosclerotic plaque. Normal caliber aorta without aneurysm, dissection, vasculitis or significant stenosis. Celiac: Patent without evidence of aneurysm, dissection, vasculitis or significant stenosis. SMA: Mild atherosclerotic plaque at the origin. Patent without evidence of aneurysm, dissection, vasculitis or significant stenosis. Renals: Mild atherosclerotic plaque at the origin. Both renal arteries are patent without evidence of aneurysm, dissection, vasculitis, fibromuscular dysplasia or significant stenosis. IMA: Mild atherosclerotic plaque. Patent without evidence of aneurysm, dissection, vasculitis or significant stenosis. Inflow: Mild atherosclerotic plaque. Patent without evidence of aneurysm, dissection, vasculitis or significant stenosis. Veins: No obvious venous abnormality within the limitations of this arterial phase study. NON-VASCULAR  Hepatobiliary: No focal liver abnormality. No gallstones, gallbladder wall thickening, or pericholecystic fluid. No biliary dilatation. Pancreas: No focal lesion. Normal pancreatic contour. No surrounding inflammatory changes. No main pancreatic ductal dilatation. Spleen: Normal in size without focal abnormality. Adrenals/Urinary Tract: No adrenal nodule bilaterally. Bilateral kidneys enhance symmetrically. No hydronephrosis. No  hydroureter. The urinary bladder is unremarkable. Stomach/Bowel: Stomach is within normal limits. No evidence of bowel wall thickening or dilatation. Appendix appears normal. Lymphatic: No lymphadenopathy. Reproductive: Prostate is unremarkable. Other: No intraperitoneal free fluid. No intraperitoneal free gas. No organized fluid collection. Musculoskeletal: No abdominal wall hernia or abnormality. No suspicious lytic or blastic osseous lesions. No acute displaced fracture. Review of the MIP images confirms the above findings. IMPRESSION: 1. No aortic dissection or aneurysm. 2. Aortic Atherosclerosis (ICD10-I70.0) with marked eccentric noncalcified atherosclerotic plaque measuring up to 8 mm along the distal aortic arch/proximal descending thoracic aorta. 3. No acute nonvascular intrathoracic, intra-abdominal, intrapelvic abnormality. Electronically Signed   By: Tish Frederickson M.D.   On: 10/13/2020 18:25    EKG: Normal sinus rhythm otherwise normal EKG  Weights: Filed Weights   10/13/20 1626  Weight: 85.3 kg     Physical Exam: Blood pressure 116/74, pulse (!) 59, temperature 98.2 F (36.8 C), temperature source Oral, resp. rate 17, height 5\' 7"  (1.702 m), weight 85.3 kg, SpO2 97 %. Body mass index is 29.44 kg/m. General: Well developed, well nourished, in no acute distress. Head eyes ears nose throat: Normocephalic, atraumatic, sclera non-icteric, no xanthomas, nares are without discharge. No apparent thyromegaly and/or mass  Lungs: Normal respiratory effort.  no wheezes, no rales, no rhonchi.  Heart: RRR with normal S1 S2. no murmur gallop, no rub, PMI is normal size and placement, carotid upstroke normal without bruit, jugular venous pressure is normal Abdomen: Soft, non-tender, non-distended with normoactive bowel sounds. No hepatomegaly. No rebound/guarding. No obvious abdominal masses. Abdominal aorta is normal size without bruit Extremities: No edema. no cyanosis, no clubbing, no ulcers   Peripheral : 2+ bilateral upper extremity pulses, 2+ bilateral femoral pulses, 2+ bilateral dorsal pedal pulse Neuro: Alert and oriented. No facial asymmetry. No focal deficit. Moves all extremities spontaneously. Musculoskeletal: Normal muscle tone without kyphosis Psych:  Responds to questions appropriately with a normal affect.    Assessment: 55 year old male with hyperlipidemia family history of cardiovascular disease and hypertension with acute non-ST elevation myocardial infarction without evidence of congestive heart failure  Plan: 1.  Continue heparin and aspirin for further risk reduction of myocardial infarction 2.  Beta-blocker ACE inhibitor as able for myocardial infarction 3.  Echocardiogram for LV systolic dysfunction valvular heart disease and extent of myocardial infarction 4.  Proceed to cardiac catheterization to assess coronary anatomy and further treatment thereof as necessary.  Patient understands the risk and benefits of cardiac catheterization.  This includes the possibility of death stroke heart attack infection bleeding or blood clot.  He is at low risk for conscious sedation  Signed, 52 M.D. [facility] Cardiology 10/14/2020, 6:33 AM

## 2020-10-14 NOTE — Progress Notes (Signed)
ANTICOAGULATION CONSULT NOTE - Initial Consult  Pharmacy Consult for heparin Indication: chest pain/ACS  No Known Allergies  Patient Measurements: Height: 5\' 7"  (170.2 cm) Weight: 85.3 kg (188 lb) IBW/kg (Calculated) : 66.1 HEPARIN DW (KG): 83.4  Vital Signs: Temp: 98.2 F (36.8 C) (03/20 1625) Temp Source: Oral (03/20 1625) BP: 128/89 (03/21 0100) Pulse Rate: 62 (03/21 0100)  Labs: Recent Labs    10/13/20 1628 10/13/20 1840 10/13/20 1930 10/13/20 2255 10/14/20 0104 10/14/20 0159  HGB 15.3  --   --   --   --   --   HCT 45.2  --   --   --   --   --   PLT 271  --   --   --   --   --   APTT  --   --  27  --   --   --   LABPROT  --   --  13.4  --   --   --   INR  --   --  1.1  --   --   --   HEPARINUNFRC  --   --   --   --   --  0.22*  CREATININE 0.95  --   --   --   --   --   TROPONINIHS 311* 2,060*  --  8,509* 11,658*  --     Estimated Creatinine Clearance: 91.7 mL/min (by C-G formula based on SCr of 0.95 mg/dL).   Medical History: Past Medical History:  Diagnosis Date  . Diabetes mellitus without complication (HCC)   . Hyperlipidemia     Assessment: 55 y.o. male presenting with chest pain and found to have elevated troponin, ACS. Pharmacy has been consulted for IV heparin dosing.   Hgb and platelets WNL. No overt bleeding noted. No prior to admission anticoagulation noted. Baseline PT/INR, CBC, and aPTT ordered.  Goal of Therapy:  Heparin level 0.3-0.7 units/ml Monitor platelets by anticoagulation protocol: Yes  Plan:  3/21:  HL @ 0159 = 0.22 Will order Heparin 1250 units IV X 1 and increase drip rate to 1250 units/hr.  Will recheck HL 6 hrs after rate change.   Ashya Nicolaisen D Clinical Pharmacist  10/13/2020   5:13 PM

## 2020-10-14 NOTE — ED Notes (Addendum)
Dr Maryfrances Bunnell and Dr Clelia Schaumann via secure chat to see if additional troponin needed to be ordered, per Dr Gwen Pounds ok to defer trop for now.

## 2020-10-14 NOTE — ED Notes (Signed)
Pt alert and oriented. Denies cp, shob. Call bell within reach, family at bedside

## 2020-10-14 NOTE — Progress Notes (Signed)
*  PRELIMINARY RESULTS* Echocardiogram 2D Echocardiogram has been performed.  Isaac Kelly 10/14/2020, 9:40 AM

## 2020-10-14 NOTE — Progress Notes (Signed)
[facility]iology [facility] Encounter Note  Patient: Isaac Kelly / Admit Date: 10/13/2020 / Date of Encounter: 10/14/2020, 3:08 PM   Subjective: Patient has had full resolution of chest discomfort and no significant EKG changes.  Opponent level consistent with non-ST elevation myocardial infarction  Cardiac catheterization showing anterior wall hypokinesis with ejection fraction of 40% Cardiac coronary angiogram showing 50% left anterior descending artery stenosis otherwise no evidence of critical abnormality  Review of Systems: Positive for: None Negative for: Vision change, hearing change, syncope, dizziness, nausea, vomiting,diarrhea, bloody stool, stomach pain, cough, congestion, diaphoresis, urinary frequency, urinary pain,skin lesions, skin rashes Others previously listed  Objective: Telemetry: Normal sinus rhythm Physical Exam: Blood pressure 132/83, pulse 69, temperature 98.6 F (37 C), temperature source Oral, resp. rate 14, height 5\' 7"  (1.702 m), weight 85.3 kg, SpO2 96 %. Body mass index is 29.45 kg/m. General: Well developed, well nourished, in no acute distress. Head: Normocephalic, atraumatic, sclera non-icteric, no xanthomas, nares are without discharge. Neck: No apparent masses Lungs: Normal respirations with no wheezes, no rhonchi, no rales , no crackles   Heart: Regular rate and rhythm, normal S1 S2, no murmur, no rub, no gallop, PMI is normal size and placement, carotid upstroke normal without bruit, jugular venous pressure normal Abdomen: Soft, non-tender, non-distended with normoactive bowel sounds. No hepatosplenomegaly. Abdominal aorta is normal size without bruit Extremities: No edema, no clubbing, no cyanosis, no ulcers,  Peripheral: 2+ radial, 2+ femoral, 2+ dorsal pedal pulses Neuro: Alert and oriented. Moves all extremities spontaneously. Psych:  Responds to questions appropriately with a normal affect.  No intake or output data in the 24 hours  ending 10/14/20 1508  Inpatient Medications:  . [MAR Hold] aspirin EC  81 mg Oral Daily  . [MAR Hold] fenofibrate  160 mg Oral Daily  . [MAR Hold] insulin aspart  0-15 Units Subcutaneous Q4H  . [MAR Hold] omega-3 acid ethyl esters  1 g Oral Daily  . [MAR Hold] pravastatin  20 mg Oral QPM  . [MAR Hold] predniSONE  10 mg Oral Q breakfast  . [MAR Hold] sodium chloride flush  3 mL Intravenous Q12H   Infusions:  . sodium chloride 50 mL/hr at 10/13/20 2055  . sodium chloride    . [START ON 10/15/2020] sodium chloride     Followed by  . [START ON 10/15/2020] sodium chloride    . heparin 1,450 Units/hr (10/14/20 1134)    Labs: Recent Labs    10/13/20 1628 10/14/20 0356  NA 135 137  K 4.0 3.6  CL 101 105  CO2 23 25  GLUCOSE 142* 104*  BUN 17 11  CREATININE 0.95 0.78  CALCIUM 9.2 8.7*  MG  --  2.3  PHOS  --  3.6   No results for input(s): AST, ALT, ALKPHOS, BILITOT, PROT, ALBUMIN in the last 72 hours. Recent Labs    10/13/20 1628 10/14/20 0356  WBC 13.0* 12.8*  HGB 15.3 14.9  HCT 45.2 44.3  MCV 87.4 87.9  PLT 271 240   No results for input(s): CKTOTAL, CKMB, TROPONINI in the last 72 hours. Invalid input(s): POCBNP Recent Labs    10/13/20 1628  HGBA1C 5.8*     Weights: Filed Weights   10/13/20 1626 10/14/20 1359  Weight: 85.3 kg 85.3 kg     Radiology/Studies:  DG Chest 2 View  Result Date: 10/13/2020 CLINICAL DATA:  Patient presents to the ED with mid-sternal chest pain radiating into his back. EXAM: CHEST - 2 VIEW COMPARISON:  Chest  radiograph 12/27/2013 FINDINGS: The cardiomediastinal contours are within normal limits. The lungs are clear. No pneumothorax or pleural effusion. No acute finding in the visualized skeleton. IMPRESSION: No acute cardiopulmonary process. Electronically Signed   By: Emmaline Kluver M.D.   On: 10/13/2020 17:03   ECHOCARDIOGRAM COMPLETE  Result Date: 10/14/2020    ECHOCARDIOGRAM REPORT   Patient Name:   Isaac Kelly Date of Exam:  10/14/2020 Medical Rec #:  [id number]      Height:       67.0 in Accession #:    [id number]     Weight:       188.0 lb Date of Birth:  1965-11-25      BSA:          1.970 m Patient Age:    54 years       BP:           119/86 mmHg Patient Gender: M              HR:           64 bpm. Exam Location:  [facility] Procedure: 2D Echo, Color Doppler, Cardiac Doppler and Strain Analysis Indications:     Elevated troponin  History:         Patient has no prior history of Echocardiogram examinations.                  Risk Factors:Diabetes and Dyslipidemia.  Sonographer:     Humphrey Rolls RDCS (AE) Referring Phys:  [id number] Oliver Pila HALL Diagnosing Phys: Harold Hedge MD  Sonographer Comments: Suboptimal subcostal window. Global longitudinal strain was attempted. IMPRESSIONS  1. Left ventricular ejection fraction, by estimation, is 50 to 55%. The left ventricle has low normal function. The left ventricle demonstrates regional wall motion abnormalities (see scoring diagram/findings for description). There is mild left ventricular hypertrophy. Left ventricular diastolic parameters were normal.  2. Right ventricular systolic function is normal. The right ventricular size is mildly enlarged.  3. Left atrial size was mildly dilated.  4. The mitral valve is grossly normal. Trivial mitral valve regurgitation.  5. The aortic valve is grossly normal. Aortic valve regurgitation is not visualized. FINDINGS  Left Ventricle: Left ventricular ejection fraction, by estimation, is 50 to 55%. The left ventricle has low normal function. The left ventricle demonstrates regional wall motion abnormalities. The left ventricular internal cavity size was normal in size. There is mild left ventricular hypertrophy. Left ventricular diastolic parameters were normal.  LV Wall Scoring: The mid anteroseptal segment is akinetic. The entire anterior wall is hypokinetic. The entire inferior wall and apex are normal. Right Ventricle: The right ventricular size is mildly  enlarged. No increase in right ventricular wall thickness. Right ventricular systolic function is normal. Left Atrium: Left atrial size was mildly dilated. Right Atrium: Right atrial size was normal in size. Pericardium: There is no evidence of pericardial effusion. Mitral Valve: The mitral valve is grossly normal. Trivial mitral valve regurgitation. MV peak gradient, 3.8 mmHg. The mean mitral valve gradient is 1.0 mmHg. Tricuspid Valve: The tricuspid valve is grossly normal. Tricuspid valve regurgitation is trivial. Aortic Valve: The aortic valve is grossly normal. Aortic valve regurgitation is not visualized. Aortic valve mean gradient measures 5.0 mmHg. Aortic valve peak gradient measures 9.4 mmHg. Aortic valve area, by VTI measures 2.01 cm. Pulmonic Valve: The pulmonic valve was not well visualized. Pulmonic valve regurgitation is trivial. Aorta: The aortic root is normal in size and structure. IAS/Shunts: The interatrial septum was not  well visualized.  LEFT VENTRICLE PLAX 2D LVIDd:         4.20 cm  Diastology LVIDs:         2.50 cm  LV e' medial:    7.51 cm/s LV PW:         1.10 cm  LV E/e' medial:  11.9 LV IVS:        1.00 cm  LV e' lateral:   9.25 cm/s LVOT diam:     2.00 cm  LV E/e' lateral: 9.6 LV SV:         62 LV SV Index:   31 LVOT Area:     3.14 cm  RIGHT VENTRICLE RV Basal diam:  3.00 cm LEFT ATRIUM             Index       RIGHT ATRIUM           Index LA diam:        3.80 cm 1.93 cm/m  RA Area:     14.00 cm LA Vol (A2C):   37.0 ml 18.78 ml/m RA Volume:   34.30 ml  17.41 ml/m LA Vol (A4C):   24.0 ml 12.18 ml/m LA Biplane Vol: 29.9 ml 15.18 ml/m  AORTIC VALVE                    PULMONIC VALVE AV Area (Vmax):    2.22 cm     PV Vmax:       0.87 m/s AV Area (Vmean):   2.25 cm     PV Vmean:      57.500 cm/s AV Area (VTI):     2.01 cm     PV VTI:        0.144 m AV Vmax:           153.00 cm/s  PV Peak grad:  3.0 mmHg AV Vmean:          108.000 cm/s PV Mean grad:  2.0 mmHg AV VTI:            0.306 m  AV Peak Grad:      9.4 mmHg AV Mean Grad:      5.0 mmHg LVOT Vmax:         108.00 cm/s LVOT Vmean:        77.300 cm/s LVOT VTI:          0.196 m LVOT/AV VTI ratio: 0.64  AORTA Ao Root diam: 3.40 cm MITRAL VALVE MV Area (PHT): 4.17 cm    SHUNTS MV Area VTI:   2.04 cm    Systemic VTI:  0.20 m MV Peak grad:  3.8 mmHg    Systemic Diam: 2.00 cm MV Mean grad:  1.0 mmHg MV Vmax:       0.97 m/s MV Vmean:      46.3 cm/s MV Decel Time: 182 msec MV E velocity: 89.10 cm/s MV A velocity: 50.60 cm/s MV E/A ratio:  1.76 Harold Hedge MD Electronically signed by Harold Hedge MD Signature Date/Time: 10/14/2020/12:02:35 PM    Final    CT Angio Chest/Abd/Pel for Dissection W and/or Wo Contrast  Result Date: 10/13/2020 CLINICAL DATA:  Abdominal pain. Aortic dissection suspected. mid-sternal chest pain radiating into his back. EXAM: CT ANGIOGRAPHY CHEST, ABDOMEN AND PELVIS TECHNIQUE: Non-contrast CT of the chest was initially obtained. Multidetector CT imaging through the chest, abdomen and pelvis was performed using the standard protocol during bolus administration of intravenous contrast. Multiplanar reconstructed images and  MIPs were obtained and reviewed to evaluate the vascular anatomy. CONTRAST:  100mL OMNIPAQUE IOHEXOL 350 MG/ML SOLN COMPARISON:  CT abdomen pelvis 09/19/2019 FINDINGS: CTA CHEST FINDINGS Cardiovascular: Preferential opacification of the thoracic aorta. No evidence of thoracic aortic aneurysm or dissection. Marked eccentric noncalcified atherosclerotic plaque measuring up to 8 mm on sagittal view (9:113) along the distal aortic arch/proximal descending thoracic aorta. Anormal heart size. No significant pericardial effusion. No coronary artery calcifications. The main pulmonary artery is normal in caliber. No central pulmonary embolus. Mediastinum/Nodes: No enlarged mediastinal, hilar, or axillary lymph nodes. Thyroid gland, trachea, and esophagus demonstrate no significant findings. Lungs/Pleura: No focal  consolidation. Trace bilateral lower lobe subsegmental atelectasis. No pulmonary nodule. No pulmonary mass. No pleural effusion. No pneumothorax. Musculoskeletal: No chest wall abnormality. No suspicious lytic or blastic osseous lesions. No acute displaced fracture. CTA ABDOMEN AND PELVIS FINDINGS VASCULAR Aorta: Mild atherosclerotic plaque. Normal caliber aorta without aneurysm, dissection, vasculitis or significant stenosis. Celiac: Patent without evidence of aneurysm, dissection, vasculitis or significant stenosis. SMA: Mild atherosclerotic plaque at the origin. Patent without evidence of aneurysm, dissection, vasculitis or significant stenosis. Renals: Mild atherosclerotic plaque at the origin. Both renal arteries are patent without evidence of aneurysm, dissection, vasculitis, fibromuscular dysplasia or significant stenosis. IMA: Mild atherosclerotic plaque. Patent without evidence of aneurysm, dissection, vasculitis or significant stenosis. Inflow: Mild atherosclerotic plaque. Patent without evidence of aneurysm, dissection, vasculitis or significant stenosis. Veins: No obvious venous abnormality within the limitations of this arterial phase study. NON-VASCULAR Hepatobiliary: No focal liver abnormality. No gallstones, gallbladder wall thickening, or pericholecystic fluid. No biliary dilatation. Pancreas: No focal lesion. Normal pancreatic contour. No surrounding inflammatory changes. No main pancreatic ductal dilatation. Spleen: Normal in size without focal abnormality. Adrenals/Urinary Tract: No adrenal nodule bilaterally. Bilateral kidneys enhance symmetrically. No hydronephrosis. No hydroureter. The urinary bladder is unremarkable. Stomach/Bowel: Stomach is within normal limits. No evidence of bowel wall thickening or dilatation. Appendix appears normal. Lymphatic: No lymphadenopathy. Reproductive: Prostate is unremarkable. Other: No intraperitoneal free fluid. No intraperitoneal free gas. No organized  fluid collection. Musculoskeletal: No abdominal wall hernia or abnormality. No suspicious lytic or blastic osseous lesions. No acute displaced fracture. Review of the MIP images confirms the above findings. IMPRESSION: 1. No aortic dissection or aneurysm. 2. Aortic Atherosclerosis (ICD10-I70.0) with marked eccentric noncalcified atherosclerotic plaque measuring up to 8 mm along the distal aortic arch/proximal descending thoracic aorta. 3. No acute nonvascular intrathoracic, intra-abdominal, intrapelvic abnormality. Electronically Signed   By: Tish FredericksonMorgane  Naveau M.D.   On: 10/13/2020 18:25     Assessment and Recommendation  55 y.o. male with hyperlipidemia borderline hypertension with significant non-ST elevation myocardial infarction of unknown etiology with no evidence of critical coronary artery disease needing further intervention at this time 1.  Discontinuation of heparin 2.  High intensity cholesterol therapy 3.  Beta-blocker ACE inhibitor as able for further treatment of LV systolic dysfunction 4.  Echocardiogram for further evaluation and treatment options of LV dysfunction and etiology of episode 5.  Cardiac rehabilitation  Signed, Arnoldo HookerBruce Nyomi Howser M.D. FACC

## 2020-10-14 NOTE — Progress Notes (Addendum)
[facility] PROGRESS [facility]    Isaac HarriesSergio Kelly  WUJ:[id number] DOB: 1966/01/07 DOA: 10/13/2020 PCP: Jeannie DonePolanco, Leonard F, MD      Brief Narrative:  Mr. Isaac Kelly is a 55 y.o. M with recent contact dermatitis on prednisone taper, pre-DM, hyperlipidemia, and borderline HTN not on meds who presented with chest pain with exertion.  In the ER, EKG normal but hstroponins rising to >10000.  Started on apsirin and heparin and admitted.       Assessment & Plan:  NSTEMI -Continue heparin -Continue aspirin -Continue pravastatin -Continue fenofibrate -Defer metoprolol in setting of HR 59 -Obtain echo -Consult Cardiology, appreciate cares    Pre-diabetes Glucoses normal. A1c 5.8, not on antidiabetics. -Continue SS corrections  Leukoctosis Reactive and due to prednisone   Poison ivy, resolving Discussed with Cardiology, okay to resume from cardiac standpoint -Continue prednisone 10 mg daily       Disposition: Status is: Inpatient  Remains inpatient appropriate because:Ongoing diagnostic testing needed not appropriate for outpatient work up and IV treatments appropriate due to intensity of illness or inability to take PO   Dispo:  Patient From: Home  Planned Disposition: Home  Medically stable for discharge: No         Level of care: Progressive Cardiac       MDM: The below labs and imaging reports were reviewed and summarized above.  Medication management as above.  Continued full dose heparin.   DVT prophylaxis:  N/a on heparin  Code Status: FULL Family Communication: wife    Consultants:   Cardiology  Procedures:   3/21 echo normal EF  3/21 cardiac cath pending          Subjective: Feelng well.  Chest pain resolved.  No swelling, dyspnea, orthopnea.  Rash resolved.  Objective: Vitals:   10/14/20 0400 10/14/20 0600 10/14/20 0700 10/14/20 0900  BP: 129/81 116/74 129/85 119/86  Pulse: 80 (!) 59 64 64  Resp: 13 17  15 14   Temp:      TempSrc:      SpO2: 96% 97% 94% 96%  Weight:      Height:       No intake or output data in the 24 hours ending 10/14/20 1003 Filed Weights   10/13/20 1626  Weight: 85.3 kg    Examination: General appearance:  adult male, alert and in no acute distress.   HEENT: Anicteric, conjunctiva pink, lids and lashes normal. No nasal deformity, discharge, epistaxis.  Lips moist, dentition in good repair, lips normal, OP moist, hearing normal.   Skin: Warm and dry.  No jaundice.  No suspicious rashes or lesions. Cardiac: RRR, nl S1-S2, no murmurs appreciated.  Capillary refill is brisk.  JVP not visible at that anlge.  No LE edema.  Radial pulses 2+ and symmetric. Respiratory: Normal respiratory rate and rhythm.  CTAB without rales or wheezes. Abdomen: Abdomen soft.  No TTP. No ascites, distension, hepatosplenomegaly.   MSK: No deformities or effusions. Neuro: Awake and alert.  EOMI, moves all extremities. Speech fluent.    Psych: Sensorium intact and responding to questions, attention normal. Affect nrmal.  Judgment and insight appear normal.    Data Reviewed: I have personally reviewed following labs and imaging studies:  CBC: Recent Labs  Lab 10/13/20 1628 10/14/20 0356  WBC 13.0* 12.8*  HGB 15.3 14.9  HCT 45.2 44.3  MCV 87.4 87.9  PLT 271 240   Basic Metabolic Panel: Recent Labs  Lab 10/13/20 1628 10/14/20 0356  NA 135 137  K 4.0 3.6  CL 101 105  CO2 23 25  GLUCOSE 142* 104*  BUN 17 11  CREATININE 0.95 0.78  CALCIUM 9.2 8.7*  MG  --  2.3  PHOS  --  3.6   GFR: Estimated Creatinine Clearance: 108.9 mL/min (by C-G formula based on SCr of 0.78 mg/dL). Liver Function Tests: No results for input(s): AST, ALT, ALKPHOS, BILITOT, PROT, ALBUMIN in the last 168 hours. No results for input(s): LIPASE, AMYLASE in the last 168 hours. No results for input(s): AMMONIA in the last 168 hours. Coagulation Profile: Recent Labs  Lab 10/13/20 1930  INR 1.1    Cardiac Enzymes: No results for input(s): CKTOTAL, CKMB, CKMBINDEX, TROPONINI in the last 168 hours. BNP (last 3 results) No results for input(s): PROBNP in the last 8760 hours. HbA1C: Recent Labs    10/13/20 1628  HGBA1C 5.8*   CBG: Recent Labs  Lab 10/13/20 2027 10/14/20 0110 10/14/20 0356 10/14/20 0722  GLUCAP 104* 104* 97 96   Lipid Profile: Recent Labs    10/13/20 1840  CHOL 201*  HDL 64  LDLCALC 119*  TRIG 90  CHOLHDL 3.1   Thyroid Function Tests: No results for input(s): TSH, T4TOTAL, FREET4, T3FREE, THYROIDAB in the last 72 hours. Anemia Panel: No results for input(s): VITAMINB12, FOLATE, FERRITIN, TIBC, IRON, RETICCTPCT in the last 72 hours. Urine analysis: No results found for: COLORURINE, APPEARANCEUR, LABSPEC, PHURINE, GLUCOSEU, HGBUR, BILIRUBINUR, KETONESUR, PROTEINUR, UROBILINOGEN, NITRITE, LEUKOCYTESUR Sepsis Labs: @LABRCNTIP (procalcitonin:4,lacticacidven:4)  ) Recent Results (from the past 240 hour(s))  Resp Panel by RT-PCR (Flu A&B, Covid) Nasopharyngeal Swab     Status: None   Collection Time: 10/13/20  5:12 PM   Specimen: Nasopharyngeal Swab; Nasopharyngeal(NP) swabs in vial transport medium  Result Value Ref Range Status   SARS Coronavirus 2 by RT PCR NEGATIVE NEGATIVE Final    Comment: (NOTE) SARS-CoV-2 target nucleic acids are NOT DETECTED.  The SARS-CoV-2 RNA is generally detectable in upper respiratory specimens during the acute phase of infection. The lowest concentration of SARS-CoV-2 viral copies this assay can detect is 138 copies/mL. A negative result does not preclude SARS-Cov-2 infection and should not be used as the sole basis for treatment or other patient management decisions. A negative result may occur with  improper specimen collection/handling, submission of specimen other than nasopharyngeal swab, presence of viral mutation(s) within the areas targeted by this assay, and inadequate number of viral copies(<138  copies/mL). A negative result must be combined with clinical observations, patient history, and epidemiological information. The expected result is Negative.  Fact Sheet for Patients:  10/15/20  Fact Sheet for Healthcare Providers:  BloggerCourse.com  This test is no t yet approved or cleared by the [employer] FDA and  has been authorized for detection and/or diagnosis of SARS-CoV-2 by FDA under an Emergency Use Authorization (EUA). This EUA will remain  in effect (meaning this test can be used) for the duration of the COVID-19 declaration under Section 564(b)(1) of the Act, 21 U.S.C.section 360bbb-3(b)(1), unless the authorization is terminated  or revoked sooner.       Influenza A by PCR NEGATIVE NEGATIVE Final   Influenza B by PCR NEGATIVE NEGATIVE Final    Comment: (NOTE) The Xpert Xpress SARS-CoV-2/FLU/RSV plus assay is intended as an aid in the diagnosis of influenza from Nasopharyngeal swab specimens and should not be used as a sole basis for treatment. Nasal washings and aspirates are unacceptable for Xpert Xpress SARS-CoV-2/FLU/RSV testing.  Fact Sheet for Patients: [place]  Fact  Sheet for Healthcare Providers: [employer]ious[employer]ker.it  This test is not yet approved or cleared by the [place] and has been authorized for detection and/or diagnosis of SARS-CoV-2 by FDA under an Emergency Use Authorization (EUA). This EUA will remain in effect (meaning this test can be used) for the duration of the COVID-19 declaration under Section 564(b)(1) of the Act, 21 U.S.C. section 360bbb-3(b)(1), unless the authorization is terminated or revoked.  Performed at [facility], [address] [place], [place] [address]          Radiology Studies: DG Chest 2 View  Result Date: 10/13/2020 CLINICAL DATA:  Patient presents to the ED with  mid-sternal chest pain radiating into his back. EXAM: CHEST - 2 VIEW COMPARISON:  Chest radiograph 12/27/2013 FINDINGS: The cardiomediastinal contours are within normal limits. The lungs are clear. No pneumothorax or pleural effusion. No acute finding in the visualized skeleton. IMPRESSION: No acute cardiopulmonary process. Electronically Signed   By: Emmaline Kluver M.D.   On: 10/13/2020 17:03   CT Angio Chest/Abd/Pel for Dissection W and/or Wo Contrast  Result Date: 10/13/2020 CLINICAL DATA:  Abdominal pain. Aortic dissection suspected. mid-sternal chest pain radiating into his back. EXAM: CT ANGIOGRAPHY CHEST, ABDOMEN AND PELVIS TECHNIQUE: Non-contrast CT of the chest was initially obtained. Multidetector CT imaging through the chest, abdomen and pelvis was performed using the standard protocol during bolus administration of intravenous contrast. Multiplanar reconstructed images and MIPs were obtained and reviewed to evaluate the vascular anatomy. CONTRAST:  OMNIPAQUE IOHEXOL 350 MG/ML SOLN COMPARISON:  CT abdomen pelvis 09/19/2019 FINDINGS: CTA CHEST FINDINGS Cardiovascular: Preferential opacification of the thoracic aorta. No evidence of thoracic aortic aneurysm or dissection. Marked eccentric noncalcified atherosclerotic plaque measuring up to 8 mm on sagittal view (9:113) along the distal aortic arch/proximal descending thoracic aorta. Anormal heart size. No significant pericardial effusion. No coronary artery calcifications. The main pulmonary artery is normal in caliber. No central pulmonary embolus. Mediastinum/Nodes: No enlarged mediastinal, hilar, or axillary lymph nodes. Thyroid gland, trachea, and esophagus demonstrate no significant findings. Lungs/Pleura: No focal consolidation. Trace bilateral lower lobe subsegmental atelectasis. No pulmonary nodule. No pulmonary mass. No pleural effusion. No pneumothorax. Musculoskeletal: No chest wall abnormality. No suspicious lytic or blastic osseous  lesions. No acute displaced fracture. CTA ABDOMEN AND PELVIS FINDINGS VASCULAR Aorta: Mild atherosclerotic plaque. Normal caliber aorta without aneurysm, dissection, vasculitis or significant stenosis. Celiac: Patent without evidence of aneurysm, dissection, vasculitis or significant stenosis. SMA: Mild atherosclerotic plaque at the origin. Patent without evidence of aneurysm, dissection, vasculitis or significant stenosis. Renals: Mild atherosclerotic plaque at the origin. Both renal arteries are patent without evidence of aneurysm, dissection, vasculitis, fibromuscular dysplasia or significant stenosis. IMA: Mild atherosclerotic plaque. Patent without evidence of aneurysm, dissection, vasculitis or significant stenosis. Inflow: Mild atherosclerotic plaque. Patent without evidence of aneurysm, dissection, vasculitis or significant stenosis. Veins: No obvious venous abnormality within the limitations of this arterial phase study. NON-VASCULAR Hepatobiliary: No focal liver abnormality. No gallstones, gallbladder wall thickening, or pericholecystic fluid. No biliary dilatation. Pancreas: No focal lesion. Normal pancreatic contour. No surrounding inflammatory changes. No main pancreatic ductal dilatation. Spleen: Normal in size without focal abnormality. Adrenals/Urinary Tract: No adrenal nodule bilaterally. Bilateral kidneys enhance symmetrically. No hydronephrosis. No hydroureter. The urinary bladder is unremarkable. Stomach/Bowel: Stomach is within normal limits. No evidence of bowel wall thickening or dilatation. Appendix appears normal. Lymphatic: No lymphadenopathy. Reproductive: Prostate is unremarkable. Other: No intraperitoneal free fluid. No intraperitoneal free gas. No organized fluid collection. Musculoskeletal: No  abdominal wall hernia or abnormality. No suspicious lytic or blastic osseous lesions. No acute displaced fracture. Review of the MIP images confirms the above findings. IMPRESSION: 1. No aortic  dissection or aneurysm. 2. Aortic Atherosclerosis (ICD10-I70.0) with marked eccentric noncalcified atherosclerotic plaque measuring up to 8 mm along the distal aortic arch/proximal descending thoracic aorta. 3. No acute nonvascular intrathoracic, intra-abdominal, intrapelvic abnormality. Electronically Signed   By: Tish Frederickson M.D.   On: 10/13/2020 18:25        Scheduled Meds: . aspirin EC  81 mg Oral Daily  . fenofibrate  160 mg Oral Daily  . insulin aspart  0-15 Units Subcutaneous Q4H  . omega-3 acid ethyl esters  1 g Oral Daily  . pravastatin  20 mg Oral QPM  . sodium chloride flush  3 mL Intravenous Q12H   Continuous Infusions: . sodium chloride 50 mL/hr at 10/13/20 2055  . heparin 1,250 Units/hr (10/14/20 0358)     LOS: 1 day    Time spent: 35 minutes    Alberteen Sam, MD [facility] 10/14/2020, 10:03 AM     Please page though Loretha Stapler or Epic secure chat:  For [employer], Higher education careers adviser

## 2020-10-14 NOTE — Progress Notes (Signed)
ANTICOAGULATION CONSULT NOTE - Initial Consult  Pharmacy Consult for heparin Indication: chest pain/ACS  No Known Allergies  Patient Measurements: Height: 5\' 7"  (170.2 cm) Weight: 85.3 kg (188 lb) IBW/kg (Calculated) : 66.1 HEPARIN DW (KG): 83.4  Vital Signs: BP: 119/86 (03/21 0900) Pulse Rate: 64 (03/21 0900)  Labs: Recent Labs    10/13/20 1628 10/13/20 1840 10/13/20 1930 10/13/20 2255 10/14/20 0104 10/14/20 0159 10/14/20 0356  HGB 15.3  --   --   --   --   --  14.9  HCT 45.2  --   --   --   --   --  44.3  PLT 271  --   --   --   --   --  240  APTT  --   --  27  --   --   --   --   LABPROT  --   --  13.4  --   --   --   --   INR  --   --  1.1  --   --   --   --   HEPARINUNFRC  --   --   --   --   --  0.22*  --   CREATININE 0.95  --   --   --   --   --  0.78  TROPONINIHS 311* 2,060*  --  8,509* 11,658*  --   --     Estimated Creatinine Clearance: 108.9 mL/min (by C-G formula based on SCr of 0.78 mg/dL).   Medical History: Past Medical History:  Diagnosis Date  . Diabetes mellitus without complication (HCC)   . Hyperlipidemia    HEPARIN DW (KG): 83.4  Assessment: 55 y.o. male presenting with chest pain and found to have elevated troponin, ACS. Pharmacy has been consulted for IV heparin dosing.   Hgb and platelets WNL. No overt bleeding noted. No prior to admission anticoagulation noted. Baseline PT/INR, CBC, and aPTT ordered.  Date Time HL Rate/Comment 3/21 0159 0.22 Subthera; 1100>1250 un/hr 3/21 1002 0.24 Subthera; 1250>1450 un/hr 3/21   Goal of Therapy:  Heparin level 0.3-0.7 units/ml Monitor platelets by anticoagulation protocol: Yes  Plan:  HL subtherapeutic at 0.24. Will order Heparin 1250 units IV X 1 and increase drip rate to 1450 units/hr.  Will recheck HL 6 hrs after rate change.   4/21 Clinical Pharmacist  10/13/2020   5:13 PM

## 2020-10-14 NOTE — Plan of Care (Signed)

## 2020-10-15 ENCOUNTER — Encounter: Payer: Self-pay | Admitting: Internal Medicine

## 2020-10-15 DIAGNOSIS — I214 Non-ST elevation (NSTEMI) myocardial infarction: Principal | ICD-10-CM

## 2020-10-15 LAB — CBC
HCT: 45.6 % (ref 39.0–52.0)
Hemoglobin: 15.4 g/dL (ref 13.0–17.0)
MCH: 29.7 pg (ref 26.0–34.0)
MCHC: 33.8 g/dL (ref 30.0–36.0)
MCV: 88 fL (ref 80.0–100.0)
Platelets: 221 10*3/uL (ref 150–400)
RBC: 5.18 MIL/uL (ref 4.22–5.81)
RDW: 12.7 % (ref 11.5–15.5)
WBC: 9.3 10*3/uL (ref 4.0–10.5)
nRBC: 0 % (ref 0.0–0.2)

## 2020-10-15 LAB — BASIC METABOLIC PANEL
Anion gap: 8 (ref 5–15)
BUN: 14 mg/dL (ref 6–20)
CO2: 22 mmol/L (ref 22–32)
Calcium: 8.9 mg/dL (ref 8.9–10.3)
Chloride: 108 mmol/L (ref 98–111)
Creatinine, Ser: 0.9 mg/dL (ref 0.61–1.24)
GFR, Estimated: >60 mL/min (ref >60)
Glucose, Bld: 101 mg/dL — ABNORMAL HIGH (ref 70–99)
Potassium: 3.9 mmol/L (ref 3.5–5.1)
Sodium: 138 mmol/L (ref 135–145)

## 2020-10-15 LAB — GLUCOSE, CAPILLARY
Glucose-Capillary: 116 mg/dL — ABNORMAL HIGH (ref 70–99)
Glucose-Capillary: 111 mg/dL — ABNORMAL HIGH (ref 70–99)

## 2020-10-15 MED ORDER — LISINOPRIL 5 MG PO TABS: 5.0000 mg | ORAL_TABLET | Freq: Every day | ORAL | 3 refills | Status: DC

## 2020-10-15 MED ORDER — METOPROLOL SUCCINATE ER 25 MG PO TB24
25.0000 mg | ORAL_TABLET | Freq: Every day | ORAL | Status: DC
  Administered 2020-10-15: 25 mg via ORAL
  Filled 2020-10-15: qty 1

## 2020-10-15 MED ORDER — ASPIRIN 81 MG PO TBEC: 81.0000 mg | DELAYED_RELEASE_TABLET | Freq: Every day | ORAL | 11 refills | Status: DC

## 2020-10-15 MED ORDER — ATORVASTATIN CALCIUM 80 MG PO TABS: 80.0000 mg | ORAL_TABLET | Freq: Every day | ORAL | 3 refills | Status: DC

## 2020-10-15 MED ORDER — LISINOPRIL 5 MG PO TABS
5.0000 mg | ORAL_TABLET | Freq: Every day | ORAL | Status: DC
  Administered 2020-10-15: 5 mg via ORAL
  Filled 2020-10-15: qty 1

## 2020-10-15 MED ORDER — CLOPIDOGREL BISULFATE 75 MG PO TABS
75.0000 mg | ORAL_TABLET | Freq: Every day | ORAL | Status: DC
  Administered 2020-10-15: 75 mg via ORAL
  Filled 2020-10-15: qty 1

## 2020-10-15 MED ORDER — CLOPIDOGREL BISULFATE 75 MG PO TABS: 75.0000 mg | ORAL_TABLET | Freq: Every day | ORAL | 3 refills | Status: DC

## 2020-10-15 MED ORDER — METOPROLOL SUCCINATE ER 25 MG PO TB24: 25.0000 mg | ORAL_TABLET | Freq: Every day | ORAL | 3 refills | Status: DC

## 2020-10-15 MED ORDER — NITROGLYCERIN 0.4 MG SL SUBL: 0.4000 mg | SUBLINGUAL_TABLET | SUBLINGUAL | 12 refills | Status: DC | PRN

## 2020-10-15 NOTE — Progress Notes (Signed)
D/c telemetry and PIV.  No questions about f/u medications or f/u with MD.  Escorted out of hospital via wheelchair by volunteers.

## 2020-10-15 NOTE — Progress Notes (Signed)
Patient ambulated around nursing station with no shortness of breath or chest pain.  He states he is ready to go home.

## 2020-10-15 NOTE — Progress Notes (Signed)
[facility] Cardiology [facility] Encounter Note  Patient: Isaac Kelly / Admit Date: 10/13/2020 / Date of Encounter: 10/15/2020, 8:45 AM   Subjective: Patient feels really well today.  No evidence of chest discomfort.  Patient tolerating medication management well without evidence of side effects.  Cardiac catheterization showing anterior wall hypokinesis with ejection fraction of 40% Cardiac coronary angiogram showing 50% left anterior descending artery stenosis otherwise no evidence of critical abnormality  Review of Systems: Positive for: None Negative for: Vision change, hearing change, syncope, dizziness, nausea, vomiting,diarrhea, bloody stool, stomach pain, cough, congestion, diaphoresis, urinary frequency, urinary pain,skin lesions, skin rashes Others previously listed  Objective: Telemetry: Normal sinus rhythm Physical Exam: Blood pressure 104/72, pulse 71, temperature 98.1 F (36.7 C), resp. rate 16, height 5\' 7"  (1.702 m), weight 81.6 kg, SpO2 96 %. Body mass index is 28.18 kg/m. General: Well developed, well nourished, in no acute distress. Head: Normocephalic, atraumatic, sclera non-icteric, no xanthomas, nares are without discharge. Neck: No apparent masses Lungs: Normal respirations with no wheezes, no rhonchi, no rales , no crackles   Heart: Regular rate and rhythm, normal S1 S2, no murmur, no rub, no gallop, PMI is normal size and placement, carotid upstroke normal without bruit, jugular venous pressure normal Abdomen: Soft, non-tender, non-distended with normoactive bowel sounds. No hepatosplenomegaly. Abdominal aorta is normal size without bruit Extremities: No edema, no clubbing, no cyanosis, no ulcers,  Peripheral: 2+ radial, 2+ femoral, 2+ dorsal pedal pulses Neuro: Alert and oriented. Moves all extremities spontaneously. Psych:  Responds to questions appropriately with a normal affect.   Intake/Output Summary (Last 24 hours) at 10/15/2020 0845 Last data  filed at 10/15/2020 0300 Gross per 24 hour  Intake 1063.3 ml  Output 550 ml  Net 513.3 ml    Inpatient Medications:  . aspirin EC  81 mg Oral Daily  . atorvastatin  80 mg Oral QHS  . clopidogrel  75 mg Oral Daily  . fenofibrate  160 mg Oral Daily  . insulin aspart  0-15 Units Subcutaneous TID WC  . lisinopril  5 mg Oral Daily  . metoprolol succinate  25 mg Oral Daily  . omega-3 acid ethyl esters  1 g Oral Daily  . predniSONE  10 mg Oral Q breakfast  . sodium chloride flush  3 mL Intravenous Q12H   Infusions:    Labs: Recent Labs    10/14/20 0356 10/15/20 0608  NA 137 138  K 3.6 3.9  CL 105 108  CO2 25 22  GLUCOSE 104* 101*  BUN 11 14  CREATININE 0.78 0.90  CALCIUM 8.7* 8.9  MG 2.3  --   PHOS 3.6  --    No results for input(s): AST, ALT, ALKPHOS, BILITOT, PROT, ALBUMIN in the last 72 hours. Recent Labs    10/14/20 0356 10/15/20 0608  WBC 12.8* 9.3  HGB 14.9 15.4  HCT 44.3 45.6  MCV 87.9 88.0  PLT 240 221   No results for input(s): CKTOTAL, CKMB, TROPONINI in the last 72 hours. Invalid input(s): POCBNP Recent Labs    10/13/20 1628  HGBA1C 5.8*     Weights: Filed Weights   10/13/20 1626 10/14/20 1359 10/15/20 0450  Weight: 85.3 kg 85.3 kg 81.6 kg     Radiology/Studies:  DG Chest 2 View  Result Date: 10/13/2020 CLINICAL DATA:  Patient presents to the ED with mid-sternal chest pain radiating into his back. EXAM: CHEST - 2 VIEW COMPARISON:  Chest radiograph 12/27/2013 FINDINGS: The cardiomediastinal contours are within normal limits.  The lungs are clear. No pneumothorax or pleural effusion. No acute finding in the visualized skeleton. IMPRESSION: No acute cardiopulmonary process. Electronically Signed   By: Emmaline KluverNancy  Ballantyne M.D.   On: 10/13/2020 17:03   CARDIAC CATHETERIZATION  Result Date: 10/14/2020  Mid LAD-1 lesion is 55% stenosed.  Mid LAD-2 lesion is 30% stenosed.  55 year old male with hypertension hyperlipidemia and family history of  cardiovascular disease with acute onset of chest discomfort with elevated troponin consistent with non-ST elevation myocardial infarction Left ventricle with anterior apical hypokinesis with ejection fraction of 40 to 45% 55% stenosis of proximal left anterior descending artery and 30% distal LAD but no evidence of critical coronary artery disease Plan Further investigation of echocardiogram for assessment of possible stress-induced cardiomyopathy non-ST ovation myocardial infarction High intensity cholesterol therapy Beta-blocker ACE inhibitor for cardiomyopathy and LV dysfunction Cardiac rehabilitation No further cardiac intervention at this time   ECHOCARDIOGRAM COMPLETE  Result Date: 10/14/2020    ECHOCARDIOGRAM REPORT   Patient Name:   Isaac HarriesSERGIO Wieber Date of Exam: 10/14/2020 Medical Rec #:  [id number]      Height:       67.0 in Accession #:    [id number]     Weight:       188.0 lb Date of Birth:  07-23-1966      BSA:          1.970 m Patient Age:    54 years       BP:           119/86 mmHg Patient Gender: M              HR:           64 bpm. Exam Location:  [facility] Procedure: 2D Echo, Color Doppler, Cardiac Doppler and Strain Analysis Indications:     Elevated troponin  History:         Patient has no prior history of Echocardiogram examinations.                  Risk Factors:Diabetes and Dyslipidemia.  Sonographer:     Humphrey RollsJoan Heiss RDCS (AE) Referring Phys:  [id number] Oliver PilaAROLE N HALL Diagnosing Phys: Harold HedgeKenneth Fath MD  Sonographer Comments: Suboptimal subcostal window. Global longitudinal strain was attempted. IMPRESSIONS  1. Left ventricular ejection fraction, by estimation, is 50 to 55%. The left ventricle has low normal function. The left ventricle demonstrates regional wall motion abnormalities (see scoring diagram/findings for description). There is mild left ventricular hypertrophy. Left ventricular diastolic parameters were normal.  2. Right ventricular systolic function is normal. The right ventricular size is  mildly enlarged.  3. Left atrial size was mildly dilated.  4. The mitral valve is grossly normal. Trivial mitral valve regurgitation.  5. The aortic valve is grossly normal. Aortic valve regurgitation is not visualized. FINDINGS  Left Ventricle: Left ventricular ejection fraction, by estimation, is 50 to 55%. The left ventricle has low normal function. The left ventricle demonstrates regional wall motion abnormalities. The left ventricular internal cavity size was normal in size. There is mild left ventricular hypertrophy. Left ventricular diastolic parameters were normal.  LV Wall Scoring: The mid anteroseptal segment is akinetic. The entire anterior wall is hypokinetic. The entire inferior wall and apex are normal. Right Ventricle: The right ventricular size is mildly enlarged. No increase in right ventricular wall thickness. Right ventricular systolic function is normal. Left Atrium: Left atrial size was mildly dilated. Right Atrium: Right atrial size was normal in size. Pericardium: There is no  evidence of pericardial effusion. Mitral Valve: The mitral valve is grossly normal. Trivial mitral valve regurgitation. MV peak gradient, 3.8 mmHg. The mean mitral valve gradient is 1.0 mmHg. Tricuspid Valve: The tricuspid valve is grossly normal. Tricuspid valve regurgitation is trivial. Aortic Valve: The aortic valve is grossly normal. Aortic valve regurgitation is not visualized. Aortic valve mean gradient measures 5.0 mmHg. Aortic valve peak gradient measures 9.4 mmHg. Aortic valve area, by VTI measures 2.01 cm. Pulmonic Valve: The pulmonic valve was not well visualized. Pulmonic valve regurgitation is trivial. Aorta: The aortic root is normal in size and structure. IAS/Shunts: The interatrial septum was not well visualized.  LEFT VENTRICLE PLAX 2D LVIDd:         4.20 cm  Diastology LVIDs:         2.50 cm  LV e' medial:    7.51 cm/s LV PW:         1.10 cm  LV E/e' medial:  11.9 LV IVS:        1.00 cm  LV e' lateral:    9.25 cm/s LVOT diam:     2.00 cm  LV E/e' lateral: 9.6 LV SV:         62 LV SV Index:   31 LVOT Area:     3.14 cm  RIGHT VENTRICLE RV Basal diam:  3.00 cm LEFT ATRIUM             Index       RIGHT ATRIUM           Index LA diam:        3.80 cm 1.93 cm/m  RA Area:     14.00 cm LA Vol (A2C):   37.0 ml 18.78 ml/m RA Volume:   34.30 ml  17.41 ml/m LA Vol (A4C):   24.0 ml 12.18 ml/m LA Biplane Vol: 29.9 ml 15.18 ml/m  AORTIC VALVE                    PULMONIC VALVE AV Area (Vmax):    2.22 cm     PV Vmax:       0.87 m/s AV Area (Vmean):   2.25 cm     PV Vmean:      57.500 cm/s AV Area (VTI):     2.01 cm     PV VTI:        0.144 m AV Vmax:           153.00 cm/s  PV Peak grad:  3.0 mmHg AV Vmean:          108.000 cm/s PV Mean grad:  2.0 mmHg AV VTI:            0.306 m AV Peak Grad:      9.4 mmHg AV Mean Grad:      5.0 mmHg LVOT Vmax:         108.00 cm/s LVOT Vmean:        77.300 cm/s LVOT VTI:          0.196 m LVOT/AV VTI ratio: 0.64  AORTA Ao Root diam: 3.40 cm MITRAL VALVE MV Area (PHT): 4.17 cm    SHUNTS MV Area VTI:   2.04 cm    Systemic VTI:  0.20 m MV Peak grad:  3.8 mmHg    Systemic Diam: 2.00 cm MV Mean grad:  1.0 mmHg MV Vmax:       0.97 m/s MV Vmean:      46.3 cm/s MV  Decel Time: 182 msec MV E velocity: 89.10 cm/s MV A velocity: 50.60 cm/s MV E/A ratio:  1.76 Harold Hedge MD Electronically signed by Harold Hedge MD Signature Date/Time: 10/14/2020/12:02:35 PM    Final    CT Angio Chest/Abd/Pel for Dissection W and/or Wo Contrast  Result Date: 10/13/2020 CLINICAL DATA:  Abdominal pain. Aortic dissection suspected. mid-sternal chest pain radiating into his back. EXAM: CT ANGIOGRAPHY CHEST, ABDOMEN AND PELVIS TECHNIQUE: Non-contrast CT of the chest was initially obtained. Multidetector CT imaging through the chest, abdomen and pelvis was performed using the standard protocol during bolus administration of intravenous contrast. Multiplanar reconstructed images and MIPs were obtained and reviewed to  evaluate the vascular anatomy. CONTRAST:  OMNIPAQUE IOHEXOL 350 MG/ML SOLN COMPARISON:  CT abdomen pelvis 09/19/2019 FINDINGS: CTA CHEST FINDINGS Cardiovascular: Preferential opacification of the thoracic aorta. No evidence of thoracic aortic aneurysm or dissection. Marked eccentric noncalcified atherosclerotic plaque measuring up to 8 mm on sagittal view (9:113) along the distal aortic arch/proximal descending thoracic aorta. Anormal heart size. No significant pericardial effusion. No coronary artery calcifications. The main pulmonary artery is normal in caliber. No central pulmonary embolus. Mediastinum/Nodes: No enlarged mediastinal, hilar, or axillary lymph nodes. Thyroid gland, trachea, and esophagus demonstrate no significant findings. Lungs/Pleura: No focal consolidation. Trace bilateral lower lobe subsegmental atelectasis. No pulmonary nodule. No pulmonary mass. No pleural effusion. No pneumothorax. Musculoskeletal: No chest wall abnormality. No suspicious lytic or blastic osseous lesions. No acute displaced fracture. CTA ABDOMEN AND PELVIS FINDINGS VASCULAR Aorta: Mild atherosclerotic plaque. Normal caliber aorta without aneurysm, dissection, vasculitis or significant stenosis. Celiac: Patent without evidence of aneurysm, dissection, vasculitis or significant stenosis. SMA: Mild atherosclerotic plaque at the origin. Patent without evidence of aneurysm, dissection, vasculitis or significant stenosis. Renals: Mild atherosclerotic plaque at the origin. Both renal arteries are patent without evidence of aneurysm, dissection, vasculitis, fibromuscular dysplasia or significant stenosis. IMA: Mild atherosclerotic plaque. Patent without evidence of aneurysm, dissection, vasculitis or significant stenosis. Inflow: Mild atherosclerotic plaque. Patent without evidence of aneurysm, dissection, vasculitis or significant stenosis. Veins: No obvious venous abnormality within the limitations of this arterial phase  study. NON-VASCULAR Hepatobiliary: No focal liver abnormality. No gallstones, gallbladder wall thickening, or pericholecystic fluid. No biliary dilatation. Pancreas: No focal lesion. Normal pancreatic contour. No surrounding inflammatory changes. No main pancreatic ductal dilatation. Spleen: Normal in size without focal abnormality. Adrenals/Urinary Tract: No adrenal nodule bilaterally. Bilateral kidneys enhance symmetrically. No hydronephrosis. No hydroureter. The urinary bladder is unremarkable. Stomach/Bowel: Stomach is within normal limits. No evidence of bowel wall thickening or dilatation. Appendix appears normal. Lymphatic: No lymphadenopathy. Reproductive: Prostate is unremarkable. Other: No intraperitoneal free fluid. No intraperitoneal free gas. No organized fluid collection. Musculoskeletal: No abdominal wall hernia or abnormality. No suspicious lytic or blastic osseous lesions. No acute displaced fracture. Review of the MIP images confirms the above findings. IMPRESSION: 1. No aortic dissection or aneurysm. 2. Aortic Atherosclerosis (ICD10-I70.0) with marked eccentric noncalcified atherosclerotic plaque measuring up to 8 mm along the distal aortic arch/proximal descending thoracic aorta. 3. No acute nonvascular intrathoracic, intra-abdominal, intrapelvic abnormality. Electronically Signed   By: Tish Frederickson M.D.   On: 10/13/2020 18:25     Assessment and Recommendation  55 y.o. male with hyperlipidemia borderline hypertension with significant non-ST elevation myocardial infarction of unknown etiology with no evidence of critical coronary artery disease needing further intervention at this time 1.  Begin ambulation and follow-up for improvements of symptoms and okay for discharge home if feeling well with follow-up next week  2.  High intensity cholesterol therapy with high intensity atorvastatin 3.  Beta-blocker ACE inhibitor as able for further treatment of LV systolic dysfunction 4.  Cardiac  rehabilitation Signed, Arnoldo Hooker M.D. FACC

## 2020-10-15 NOTE — Discharge Instructions (Signed)
Please resume normal work activities next week 10/21/20.

## 2020-10-16 NOTE — Discharge Summary (Addendum)
Physician Discharge Summary  Isaac Kelly ZOX:[id number] DOB: November 08, 1965 DOA: 10/13/2020  PCP: Jeannie Done, MD  Admit date: 10/13/2020 Discharge date: 10/15/2020  Admitted From: Home  Disposition:  Home   Recommendations for Outpatient Follow-up:  1. Follow up with PCP Dr. Hilda Blades in 1-2 weeks 2. Dr. Hilda Blades: Please check cholesterol on new atorvastatin 80 mg nightly 3. Follow up with Cardiology Dr. Gwen Pounds as directed 4. Dr. Hilda Blades: Please follow up BP on metoprolol and lisinopril       Home Health: None  Equipment/Devices: None new  Discharge Condition: Fair  CODE STATUS: FULL Diet recommendation: Cardiac  Brief/Interim Summary: Isaac Kelly is a 55 y.o. M with recent contact dermatitis on prednisone taper, pre-DM, hyperlipidemia, and borderline HTN not on meds who presented with chest pain with exertion.  In the ER, EKG normal but hstroponins rising to >10000.  Started on apsirin and heparin and admitted.     PRINCIPAL HOSPITAL DIAGNOSIS: NSTEMI    Discharge Diagnoses:   NSTEMI Patient was admitted on heparin and aspirin. Taken to cath lab where angiography showed no residual thrombus, no stent placed, 50% LAD.  Echo showed EF 50-55% and normal valves.  He was started on aspirin and Plavix.  Cardiology follow up was arranged.   His pravastatin was switched to atorvastatin 80 mg nightly.  He was discharged with metoprolol and lisinopril.    Did the patient have an acute coronary syndrome (MI, NSTEMI, STEMI, etc) this admission?:  Yes                               AHA/ACC Clinical Performance & Quality Measures: 1. Aspirin prescribed? - Yes 2. ADP Receptor Inhibitor (Plavix/Clopidogrel, Brilinta/Ticagrelor or Effient/Prasugrel) prescribed (includes medically managed patients)? - Yes 3. Beta Blocker prescribed? - Yes 4. High Intensity Statin (Lipitor 40-80mg  or Crestor 20-40mg ) prescribed? - Yes 5. EF assessed during THIS hospitalization? -  Yes 6. For EF <40%, was ACEI/ARB prescribed? - Not Applicable (EF >/= 40%) 7. For EF <40%, Aldosterone Antagonist (Spironolactone or Eplerenone) prescribed? - Not Applicable (EF >/= 40%) 8. Cardiac Rehab Phase II ordered (including medically managed patients)? - Yes       Pre-diabetes Glucoses normal. A1c 5.8, not on antidiabetics.  Poison ivy, resolving Discussed with Cardiology, he should finish his prednisone taper.            Discharge Instructions  Discharge Instructions    AMB Referral to Cardiac Rehabilitation - Phase II   Complete by: As directed    Diagnosis: NSTEMI   Diet - low sodium heart healthy   Complete by: As directed    Discharge instructions   Complete by: As directed    From Drs Maryfrances Bunnell and Gwen Pounds: You were admitted for chest pain Here we found that this was from your heart. Thankfully, this did not require a stent, but we can see that you have fatty plaque build up in the arteries, and that the heart squeeze is weak.   For this you should take: Aspirin 81 mg one daily from now on  Clopidogrel/Plavix 75 mg daily  Aspirin and Plavix are both for making the clotting cells in your blood less "sticky" so they don't cause blockages  Take a cholesterol medicine to reduce fatty plaques: Take atorvastatin 80 mg nightly from now on DO NOT take this with pravastatin, stop the pravastatin   STart the two new heart protective medicines (also blood pressure medicines) lisinopril  and metoprolol Take metoprolol XL 25 mg daily Take lisinopril 5 mg daily   For chest pain: Take a nitroglycerin under the tongue If you need more than 3 nitro in an hour, call 9-1-1   Go see your primary care in 1 week and discuss these new medicines changes with him Go see the cardiologist Dr. Gwen Pounds as he told you   Increase activity slowly   Complete by: As directed      Allergies as of 10/15/2020   No Known Allergies     Medication List    STOP  taking these medications   pravastatin 20 MG tablet Commonly known as: PRAVACHOL     TAKE these medications   aspirin 81 MG EC tablet Take 1 tablet (81 mg total) by mouth daily. Swallow whole.   atorvastatin 80 MG tablet Commonly known as: LIPITOR Take 1 tablet (80 mg total) by mouth at bedtime.   clopidogrel 75 MG tablet Commonly known as: PLAVIX Take 1 tablet (75 mg total) by mouth daily.   fenofibrate 145 MG tablet Commonly known as: TRICOR Take 145 mg by mouth every evening.   lisinopril 5 MG tablet Commonly known as: ZESTRIL Take 1 tablet (5 mg total) by mouth daily.   metoprolol succinate 25 MG 24 hr tablet Commonly known as: TOPROL-XL Take 1 tablet (25 mg total) by mouth daily.   nitroGLYCERIN 0.4 MG SL tablet Commonly known as: NITROSTAT Place 1 tablet (0.4 mg total) under the tongue every 5 (five) minutes as needed for chest pain.   Omega 3 1000 MG Caps Take 1 capsule by mouth daily.   predniSONE 10 MG tablet Commonly known as: DELTASONE Take 10-30 mg by mouth See admin instructions. Take 3 tablets ( ) by mouth daily for three days, take 2 tablets ( ) by mouth daily for 3 days then take 1 tablet ( ) by mouth daily for five days       Follow-up Information    Lamar Blinks, MD On 10/23/2020.   Specialty: Cardiology Why: @ 9:45am Contact information: [address] [facility] [place] [address] [phone]              No Known Allergies  Consultations:  Cardiology   Procedures/Studies: DG Chest 2 View  Result Date: 10/13/2020 CLINICAL DATA:  Patient presents to the ED with mid-sternal chest pain radiating into his back. EXAM: CHEST - 2 VIEW COMPARISON:  Chest radiograph 12/27/2013 FINDINGS: The cardiomediastinal contours are within normal limits. The lungs are clear. No pneumothorax or pleural effusion. No acute finding in the visualized skeleton. IMPRESSION: No acute cardiopulmonary process.  Electronically Signed   By: Emmaline Kluver M.D.   On: 10/13/2020 17:03   CARDIAC CATHETERIZATION  Result Date: 10/14/2020  Mid LAD-1 lesion is 55% stenosed.  Mid LAD-2 lesion is 30% stenosed.  55 year old male with hypertension hyperlipidemia and family history of cardiovascular disease with acute onset of chest discomfort with elevated troponin consistent with non-ST elevation myocardial infarction Left ventricle with anterior apical hypokinesis with ejection fraction of 40 to 45% 55% stenosis of proximal left anterior descending artery and 30% distal LAD but no evidence of critical coronary artery disease Plan Further investigation of echocardiogram for assessment of possible stress-induced cardiomyopathy non-ST ovation myocardial infarction High intensity cholesterol therapy Beta-blocker ACE inhibitor for cardiomyopathy and LV dysfunction Cardiac rehabilitation No further cardiac intervention at this time   ECHOCARDIOGRAM COMPLETE  Result Date: 10/14/2020    ECHOCARDIOGRAM REPORT   Patient Name:  Isaac Kelly Date of Exam: 10/14/2020 Medical Rec #:  [id number]      Height:       67.0 in Accession #:    [id number]     Weight:       188.0 lb Date of Birth:  1965/08/30      BSA:          1.970 m Patient Age:    54 years       BP:           119/86 mmHg Patient Gender: M              HR:           64 bpm. Exam Location:  [facility] Procedure: 2D Echo, Color Doppler, Cardiac Doppler and Strain Analysis Indications:     Elevated troponin  History:         Patient has no prior history of Echocardiogram examinations.                  Risk Factors:Diabetes and Dyslipidemia.  Sonographer:     Humphrey Rolls RDCS (AE) Referring Phys:  [id number] Oliver Pila HALL Diagnosing Phys: Harold Hedge MD  Sonographer Comments: Suboptimal subcostal window. Global longitudinal strain was attempted. IMPRESSIONS  1. Left ventricular ejection fraction, by estimation, is 50 to 55%. The left ventricle has low normal function. The left  ventricle demonstrates regional wall motion abnormalities (see scoring diagram/findings for description). There is mild left ventricular hypertrophy. Left ventricular diastolic parameters were normal.  2. Right ventricular systolic function is normal. The right ventricular size is mildly enlarged.  3. Left atrial size was mildly dilated.  4. The mitral valve is grossly normal. Trivial mitral valve regurgitation.  5. The aortic valve is grossly normal. Aortic valve regurgitation is not visualized. FINDINGS  Left Ventricle: Left ventricular ejection fraction, by estimation, is 50 to 55%. The left ventricle has low normal function. The left ventricle demonstrates regional wall motion abnormalities. The left ventricular internal cavity size was normal in size. There is mild left ventricular hypertrophy. Left ventricular diastolic parameters were normal.  LV Wall Scoring: The mid anteroseptal segment is akinetic. The entire anterior wall is hypokinetic. The entire inferior wall and apex are normal. Right Ventricle: The right ventricular size is mildly enlarged. No increase in right ventricular wall thickness. Right ventricular systolic function is normal. Left Atrium: Left atrial size was mildly dilated. Right Atrium: Right atrial size was normal in size. Pericardium: There is no evidence of pericardial effusion. Mitral Valve: The mitral valve is grossly normal. Trivial mitral valve regurgitation. MV peak gradient, 3.8 mmHg. The mean mitral valve gradient is 1.0 mmHg. Tricuspid Valve: The tricuspid valve is grossly normal. Tricuspid valve regurgitation is trivial. Aortic Valve: The aortic valve is grossly normal. Aortic valve regurgitation is not visualized. Aortic valve mean gradient measures 5.0 mmHg. Aortic valve peak gradient measures 9.4 mmHg. Aortic valve area, by VTI measures 2.01 cm. Pulmonic Valve: The pulmonic valve was not well visualized. Pulmonic valve regurgitation is trivial. Aorta: The aortic root is  normal in size and structure. IAS/Shunts: The interatrial septum was not well visualized.  LEFT VENTRICLE PLAX 2D LVIDd:         4.20 cm  Diastology LVIDs:         2.50 cm  LV e' medial:    7.51 cm/s LV PW:         1.10 cm  LV E/e' medial:  11.9 LV IVS:  1.00 cm  LV e' lateral:   9.25 cm/s LVOT diam:     2.00 cm  LV E/e' lateral: 9.6 LV SV:         62 LV SV Index:   31 LVOT Area:     3.14 cm  RIGHT VENTRICLE RV Basal diam:  3.00 cm LEFT ATRIUM             Index       RIGHT ATRIUM           Index LA diam:        3.80 cm 1.93 cm/m  RA Area:     14.00 cm LA Vol (A2C):   37.0 ml 18.78 ml/m RA Volume:   34.30 ml  17.41 ml/m LA Vol (A4C):   24.0 ml 12.18 ml/m LA Biplane Vol: 29.9 ml 15.18 ml/m  AORTIC VALVE                    PULMONIC VALVE AV Area (Vmax):    2.22 cm     PV Vmax:       0.87 m/s AV Area (Vmean):   2.25 cm     PV Vmean:      57.500 cm/s AV Area (VTI):     2.01 cm     PV VTI:        0.144 m AV Vmax:           153.00 cm/s  PV Peak grad:  3.0 mmHg AV Vmean:          108.000 cm/s PV Mean grad:  2.0 mmHg AV VTI:            0.306 m AV Peak Grad:      9.4 mmHg AV Mean Grad:      5.0 mmHg LVOT Vmax:         108.00 cm/s LVOT Vmean:        77.300 cm/s LVOT VTI:          0.196 m LVOT/AV VTI ratio: 0.64  AORTA Ao Root diam: 3.40 cm MITRAL VALVE MV Area (PHT): 4.17 cm    SHUNTS MV Area VTI:   2.04 cm    Systemic VTI:  0.20 m MV Peak grad:  3.8 mmHg    Systemic Diam: 2.00 cm MV Mean grad:  1.0 mmHg MV Vmax:       0.97 m/s MV Vmean:      46.3 cm/s MV Decel Time: 182 msec MV E velocity: 89.10 cm/s MV A velocity: 50.60 cm/s MV E/A ratio:  1.76 Harold Hedge MD Electronically signed by Harold Hedge MD Signature Date/Time: 10/14/2020/12:02:35 PM    Final    CT Angio Chest/Abd/Pel for Dissection W and/or Wo Contrast  Result Date: 10/13/2020 CLINICAL DATA:  Abdominal pain. Aortic dissection suspected. mid-sternal chest pain radiating into his back. EXAM: CT ANGIOGRAPHY CHEST, ABDOMEN AND PELVIS TECHNIQUE:  Non-contrast CT of the chest was initially obtained. Multidetector CT imaging through the chest, abdomen and pelvis was performed using the standard protocol during bolus administration of intravenous contrast. Multiplanar reconstructed images and MIPs were obtained and reviewed to evaluate the vascular anatomy. CONTRAST:  OMNIPAQUE IOHEXOL 350 MG/ML SOLN COMPARISON:  CT abdomen pelvis 09/19/2019 FINDINGS: CTA CHEST FINDINGS Cardiovascular: Preferential opacification of the thoracic aorta. No evidence of thoracic aortic aneurysm or dissection. Marked eccentric noncalcified atherosclerotic plaque measuring up to 8 mm on sagittal view (9:113) along the distal aortic arch/proximal descending thoracic aorta. Anormal heart  size. No significant pericardial effusion. No coronary artery calcifications. The main pulmonary artery is normal in caliber. No central pulmonary embolus. Mediastinum/Nodes: No enlarged mediastinal, hilar, or axillary lymph nodes. Thyroid gland, trachea, and esophagus demonstrate no significant findings. Lungs/Pleura: No focal consolidation. Trace bilateral lower lobe subsegmental atelectasis. No pulmonary nodule. No pulmonary mass. No pleural effusion. No pneumothorax. Musculoskeletal: No chest wall abnormality. No suspicious lytic or blastic osseous lesions. No acute displaced fracture. CTA ABDOMEN AND PELVIS FINDINGS VASCULAR Aorta: Mild atherosclerotic plaque. Normal caliber aorta without aneurysm, dissection, vasculitis or significant stenosis. Celiac: Patent without evidence of aneurysm, dissection, vasculitis or significant stenosis. SMA: Mild atherosclerotic plaque at the origin. Patent without evidence of aneurysm, dissection, vasculitis or significant stenosis. Renals: Mild atherosclerotic plaque at the origin. Both renal arteries are patent without evidence of aneurysm, dissection, vasculitis, fibromuscular dysplasia or significant stenosis. IMA: Mild atherosclerotic plaque. Patent  without evidence of aneurysm, dissection, vasculitis or significant stenosis. Inflow: Mild atherosclerotic plaque. Patent without evidence of aneurysm, dissection, vasculitis or significant stenosis. Veins: No obvious venous abnormality within the limitations of this arterial phase study. NON-VASCULAR Hepatobiliary: No focal liver abnormality. No gallstones, gallbladder wall thickening, or pericholecystic fluid. No biliary dilatation. Pancreas: No focal lesion. Normal pancreatic contour. No surrounding inflammatory changes. No main pancreatic ductal dilatation. Spleen: Normal in size without focal abnormality. Adrenals/Urinary Tract: No adrenal nodule bilaterally. Bilateral kidneys enhance symmetrically. No hydronephrosis. No hydroureter. The urinary bladder is unremarkable. Stomach/Bowel: Stomach is within normal limits. No evidence of bowel wall thickening or dilatation. Appendix appears normal. Lymphatic: No lymphadenopathy. Reproductive: Prostate is unremarkable. Other: No intraperitoneal free fluid. No intraperitoneal free gas. No organized fluid collection. Musculoskeletal: No abdominal wall hernia or abnormality. No suspicious lytic or blastic osseous lesions. No acute displaced fracture. Review of the MIP images confirms the above findings. IMPRESSION: 1. No aortic dissection or aneurysm. 2. Aortic Atherosclerosis (ICD10-I70.0) with marked eccentric noncalcified atherosclerotic plaque measuring up to 8 mm along the distal aortic arch/proximal descending thoracic aorta. 3. No acute nonvascular intrathoracic, intra-abdominal, intrapelvic abnormality. Electronically Signed   By: Tish FredericksonMorgane  Naveau M.D.   On: 10/13/2020 18:25       Subjective: Feeling well . No chest pain, no palpitations, no dyspnea, no orthopnea, no dyspnea on exertion.    Discharge Exam: Vitals:   10/15/20 0450 10/15/20 0731  BP: 95/70 104/72  Pulse: 70 71  Resp: 18 16  Temp: 97.9 F (36.6 C) 98.1 F (36.7 C)  SpO2: 95% 96%    Vitals:   10/14/20 1750 10/14/20 2002 10/15/20 0450 10/15/20 0731  BP: 108/75 97/72 95/70  104/72  Pulse: 72 71 70 71  Resp: 18 18 18 16   Temp: 99.2 F (37.3 C) 98.3 F (36.8 C) 97.9 F (36.6 C) 98.1 F (36.7 C)  TempSrc: Oral     SpO2: 97% 96% 95% 96%  Weight:   81.6 kg   Height:        General: Pt is alert, awake, not in acute distress Cardiovascular: RRR, nl S1-S2, no murmurs appreciated.   No LE edema.   Respiratory: Normal respiratory rate and rhythm.  CTAB without rales or wheezes. Abdominal: Abdomen soft and non-tender.  No distension or HSM.   Neuro/Psych: Strength symmetric in upper and lower extremities.  Judgment and insight appear normal.   The results of significant diagnostics from this hospitalization (including imaging, microbiology, ancillary and laboratory) are listed below for reference.     Microbiology: Recent Results (from the past 240 hour(s))  Resp Panel  by RT-PCR (Flu A&B, Covid) Nasopharyngeal Swab     Status: None   Collection Time: 10/13/20  5:12 PM   Specimen: Nasopharyngeal Swab; Nasopharyngeal(NP) swabs in vial transport medium  Result Value Ref Range Status   SARS Coronavirus 2 by RT PCR NEGATIVE NEGATIVE Final    Comment: (NOTE) SARS-CoV-2 target nucleic acids are NOT DETECTED.  The SARS-CoV-2 RNA is generally detectable in upper respiratory specimens during the acute phase of infection. The lowest concentration of SARS-CoV-2 viral copies this assay can detect is 138 copies/mL. A negative result does not preclude SARS-Cov-2 infection and should not be used as the sole basis for treatment or other patient management decisions. A negative result may occur with  improper specimen collection/handling, submission of specimen other than nasopharyngeal swab, presence of viral mutation(s) within the areas targeted by this assay, and inadequate number of viral copies(<138 copies/mL). A negative result must be combined with clinical  observations, patient history, and epidemiological information. The expected result is Negative.  Fact Sheet for Patients:  BloggerCourse.com  Fact Sheet for Healthcare Providers:  SeriousBroker.it  This test is no t yet approved or cleared by the [place] FDA and  has been authorized for detection and/or diagnosis of SARS-CoV-2 by FDA under an Emergency Use Authorization (EUA). This EUA will remain  in effect (meaning this test can be used) for the duration of the COVID-19 declaration under Section 564(b)(1) of the Act, 21 U.S.C.section 360bbb-3(b)(1), unless the authorization is terminated  or revoked sooner.       Influenza A by PCR NEGATIVE NEGATIVE Final   Influenza B by PCR NEGATIVE NEGATIVE Final    Comment: (NOTE) The Xpert Xpress SARS-CoV-2/FLU/RSV plus assay is intended as an aid in the diagnosis of influenza from Nasopharyngeal swab specimens and should not be used as a sole basis for treatment. Nasal washings and aspirates are unacceptable for Xpert Xpress SARS-CoV-2/FLU/RSV testing.  Fact Sheet for Patients: BloggerCourse.com  Fact Sheet for Healthcare Providers: SeriousBroker.it  This test is not yet approved or cleared by the [place] FDA and has been authorized for detection and/or diagnosis of SARS-CoV-2 by FDA under an Emergency Use Authorization (EUA). This EUA will remain in effect (meaning this test can be used) for the duration of the COVID-19 declaration under Section 564(b)(1) of the Act, 21 U.S.C. section 360bbb-3(b)(1), unless the authorization is terminated or revoked.  Performed at [facility], [address] [place], [place] [address]      Labs: BNP (last 3 results) Recent Labs    10/13/20 2357  BNP 100.5*   Basic Metabolic Panel: Recent Labs  Lab 10/13/20 1628 10/14/20 0356 10/15/20 0608  NA 135 137 138  K  4.0 3.6 3.9  CL 101 105 108  CO2 23 25 22   GLUCOSE 142* 104* 101*  BUN 17 11 14   CREATININE 0.95 0.78 0.90  CALCIUM 9.2 8.7* 8.9  MG  --  2.3  --   PHOS  --  3.6  --    Liver Function Tests: No results for input(s): AST, ALT, ALKPHOS, BILITOT, PROT, ALBUMIN in the last 168 hours. No results for input(s): LIPASE, AMYLASE in the last 168 hours. No results for input(s): AMMONIA in the last 168 hours. CBC: Recent Labs  Lab 10/13/20 1628 10/14/20 0356 10/15/20 0608  WBC 13.0* 12.8* 9.3  HGB 15.3 14.9 15.4  HCT 45.2 44.3 45.6  MCV 87.4 87.9 88.0  PLT 271 240 221   Cardiac Enzymes: No results for input(s):  CKTOTAL, CKMB, CKMBINDEX, TROPONINI in the last 168 hours. BNP: Invalid input(s): POCBNP CBG: Recent Labs  Lab 10/14/20 0722 10/14/20 1751 10/14/20 2001 10/15/20 0731 10/15/20 1147  GLUCAP 96 132* 133* 116* 111*   D-Dimer No results for input(s): DDIMER in the last 72 hours. Hgb A1c Recent Labs    10/13/20 1628  HGBA1C 5.8*   Lipid Profile Recent Labs    10/13/20 1840  CHOL 201*  HDL 64  LDLCALC 119*  TRIG 90  CHOLHDL 3.1   Thyroid function studies No results for input(s): TSH, T4TOTAL, T3FREE, THYROIDAB in the last 72 hours.  Invalid input(s): FREET3 Anemia work up No results for input(s): VITAMINB12, FOLATE, FERRITIN, TIBC, IRON, RETICCTPCT in the last 72 hours. Urinalysis No results found for: COLORURINE, APPEARANCEUR, LABSPEC, PHURINE, GLUCOSEU, HGBUR, BILIRUBINUR, KETONESUR, PROTEINUR, UROBILINOGEN, NITRITE, LEUKOCYTESUR Sepsis Labs Invalid input(s): PROCALCITONIN,  WBC,  LACTICIDVEN Microbiology Recent Results (from the past 240 hour(s))  Resp Panel by RT-PCR (Flu A&B, Covid) Nasopharyngeal Swab     Status: None   Collection Time: 10/13/20  5:12 PM   Specimen: Nasopharyngeal Swab; Nasopharyngeal(NP) swabs in vial transport medium  Result Value Ref Range Status   SARS Coronavirus 2 by RT PCR NEGATIVE NEGATIVE Final    Comment:  (NOTE) SARS-CoV-2 target nucleic acids are NOT DETECTED.  The SARS-CoV-2 RNA is generally detectable in upper respiratory specimens during the acute phase of infection. The lowest concentration of SARS-CoV-2 viral copies this assay can detect is 138 copies/mL. A negative result does not preclude SARS-Cov-2 infection and should not be used as the sole basis for treatment or other patient management decisions. A negative result may occur with  improper specimen collection/handling, submission of specimen other than nasopharyngeal swab, presence of viral mutation(s) within the areas targeted by this assay, and inadequate number of viral copies(<138 copies/mL). A negative result must be combined with clinical observations, patient history, and epidemiological information. The expected result is Negative.  Fact Sheet for Patients:  BloggerCourse.com  Fact Sheet for Healthcare Providers:  SeriousBroker.it  This test is no t yet approved or cleared by the [place] FDA and  has been authorized for detection and/or diagnosis of SARS-CoV-2 by FDA under an Emergency Use Authorization (EUA). This EUA will remain  in effect (meaning this test can be used) for the duration of the COVID-19 declaration under Section 564(b)(1) of the Act, 21 U.S.C.section 360bbb-3(b)(1), unless the authorization is terminated  or revoked sooner.       Influenza A by PCR NEGATIVE NEGATIVE Final   Influenza B by PCR NEGATIVE NEGATIVE Final    Comment: (NOTE) The Xpert Xpress SARS-CoV-2/FLU/RSV plus assay is intended as an aid in the diagnosis of influenza from Nasopharyngeal swab specimens and should not be used as a sole basis for treatment. Nasal washings and aspirates are unacceptable for Xpert Xpress SARS-CoV-2/FLU/RSV testing.  Fact Sheet for Patients: BloggerCourse.com  Fact Sheet for Healthcare  Providers: SeriousBroker.it  This test is not yet approved or cleared by the [place] FDA and has been authorized for detection and/or diagnosis of SARS-CoV-2 by FDA under an Emergency Use Authorization (EUA). This EUA will remain in effect (meaning this test can be used) for the duration of the COVID-19 declaration under Section 564(b)(1) of the Act, 21 U.S.C. section 360bbb-3(b)(1), unless the authorization is terminated or revoked.  Performed at [facility], [address], [place], [place] [address]      Time coordinating discharge: 25 minutes  SIGNED:   Alberteen Sam, MD  [facility] 10/15/2020, 4:12 PM

## 2020-10-23 DIAGNOSIS — I214 Non-ST elevation (NSTEMI) myocardial infarction: Secondary | ICD-10-CM | POA: Diagnosis not present

## 2020-10-23 DIAGNOSIS — I2510 Atherosclerotic heart disease of native coronary artery without angina pectoris: Secondary | ICD-10-CM | POA: Diagnosis not present

## 2020-10-23 DIAGNOSIS — E782 Mixed hyperlipidemia: Secondary | ICD-10-CM | POA: Diagnosis not present

## 2020-11-24 DIAGNOSIS — Z419 Encounter for procedure for purposes other than remedying health state, unspecified: Secondary | ICD-10-CM | POA: Diagnosis not present

## 2020-11-29 ENCOUNTER — Telehealth (HOSPITAL_COMMUNITY): Payer: Self-pay

## 2020-11-29 NOTE — Telephone Encounter (Signed)
Pt is not interested in the cardiac rehab program. Closed referral 

## 2020-12-25 DIAGNOSIS — Z419 Encounter for procedure for purposes other than remedying health state, unspecified: Secondary | ICD-10-CM | POA: Diagnosis not present

## 2021-01-24 DIAGNOSIS — Z419 Encounter for procedure for purposes other than remedying health state, unspecified: Secondary | ICD-10-CM | POA: Diagnosis not present

## 2021-02-18 DIAGNOSIS — E782 Mixed hyperlipidemia: Secondary | ICD-10-CM | POA: Diagnosis not present

## 2021-02-18 DIAGNOSIS — I2510 Atherosclerotic heart disease of native coronary artery without angina pectoris: Secondary | ICD-10-CM | POA: Diagnosis not present

## 2021-02-24 DIAGNOSIS — Z419 Encounter for procedure for purposes other than remedying health state, unspecified: Secondary | ICD-10-CM | POA: Diagnosis not present

## 2021-03-19 DIAGNOSIS — I2510 Atherosclerotic heart disease of native coronary artery without angina pectoris: Secondary | ICD-10-CM | POA: Diagnosis not present

## 2021-03-27 DIAGNOSIS — I2510 Atherosclerotic heart disease of native coronary artery without angina pectoris: Secondary | ICD-10-CM | POA: Diagnosis not present

## 2021-03-27 DIAGNOSIS — E782 Mixed hyperlipidemia: Secondary | ICD-10-CM | POA: Diagnosis not present

## 2021-03-27 DIAGNOSIS — Z419 Encounter for procedure for purposes other than remedying health state, unspecified: Secondary | ICD-10-CM | POA: Diagnosis not present

## 2021-03-27 DIAGNOSIS — I10 Essential (primary) hypertension: Secondary | ICD-10-CM | POA: Diagnosis not present

## 2021-04-26 DIAGNOSIS — Z419 Encounter for procedure for purposes other than remedying health state, unspecified: Secondary | ICD-10-CM | POA: Diagnosis not present

## 2021-05-27 DIAGNOSIS — Z419 Encounter for procedure for purposes other than remedying health state, unspecified: Secondary | ICD-10-CM | POA: Diagnosis not present

## 2021-06-17 DIAGNOSIS — S62642A Nondisplaced fracture of proximal phalanx of right middle finger, initial encounter for closed fracture: Secondary | ICD-10-CM | POA: Diagnosis not present

## 2021-06-26 DIAGNOSIS — Z419 Encounter for procedure for purposes other than remedying health state, unspecified: Secondary | ICD-10-CM | POA: Diagnosis not present

## 2021-07-27 DIAGNOSIS — Z419 Encounter for procedure for purposes other than remedying health state, unspecified: Secondary | ICD-10-CM | POA: Diagnosis not present

## 2021-08-27 DIAGNOSIS — Z419 Encounter for procedure for purposes other than remedying health state, unspecified: Secondary | ICD-10-CM | POA: Diagnosis not present

## 2021-09-24 DIAGNOSIS — Z419 Encounter for procedure for purposes other than remedying health state, unspecified: Secondary | ICD-10-CM | POA: Diagnosis not present

## 2021-10-25 DIAGNOSIS — Z419 Encounter for procedure for purposes other than remedying health state, unspecified: Secondary | ICD-10-CM | POA: Diagnosis not present

## 2021-11-24 DIAGNOSIS — Z419 Encounter for procedure for purposes other than remedying health state, unspecified: Secondary | ICD-10-CM | POA: Diagnosis not present

## 2021-12-19 ENCOUNTER — Emergency Department (HOSPITAL_COMMUNITY): Payer: Medicaid Other

## 2021-12-19 ENCOUNTER — Emergency Department (HOSPITAL_COMMUNITY)
Admission: EM | Admit: 2021-12-19 | Discharge: 2021-12-19 | Disposition: A | Discharge status: Home / Self Care | Payer: Medicaid Other | Attending: Emergency Medicine | Admitting: Emergency Medicine

## 2021-12-19 DIAGNOSIS — E119 Type 2 diabetes mellitus without complications: Secondary | ICD-10-CM | POA: Insufficient documentation

## 2021-12-19 DIAGNOSIS — Z7982 Long term (current) use of aspirin: Secondary | ICD-10-CM | POA: Diagnosis not present

## 2021-12-19 DIAGNOSIS — W228XXA Striking against or struck by other objects, initial encounter: Secondary | ICD-10-CM | POA: Insufficient documentation

## 2021-12-19 DIAGNOSIS — M25572 Pain in left ankle and joints of left foot: Secondary | ICD-10-CM | POA: Diagnosis not present

## 2021-12-19 DIAGNOSIS — Y990 Civilian activity done for income or pay: Secondary | ICD-10-CM | POA: Insufficient documentation

## 2021-12-19 DIAGNOSIS — S0990XA Unspecified injury of head, initial encounter: Secondary | ICD-10-CM

## 2021-12-19 DIAGNOSIS — S0001XA Abrasion of scalp, initial encounter: Secondary | ICD-10-CM | POA: Diagnosis not present

## 2021-12-19 DIAGNOSIS — M7989 Other specified soft tissue disorders: Secondary | ICD-10-CM | POA: Diagnosis not present

## 2021-12-19 DIAGNOSIS — Z23 Encounter for immunization: Secondary | ICD-10-CM | POA: Diagnosis not present

## 2021-12-19 DIAGNOSIS — R519 Headache, unspecified: Secondary | ICD-10-CM | POA: Diagnosis not present

## 2021-12-19 DIAGNOSIS — I10 Essential (primary) hypertension: Secondary | ICD-10-CM | POA: Diagnosis not present

## 2021-12-19 DIAGNOSIS — M542 Cervicalgia: Secondary | ICD-10-CM | POA: Insufficient documentation

## 2021-12-19 DIAGNOSIS — S99912A Unspecified injury of left ankle, initial encounter: Secondary | ICD-10-CM | POA: Diagnosis not present

## 2021-12-19 DIAGNOSIS — W19XXXA Unspecified fall, initial encounter: Secondary | ICD-10-CM | POA: Diagnosis not present

## 2021-12-19 DIAGNOSIS — R609 Edema, unspecified: Secondary | ICD-10-CM | POA: Diagnosis not present

## 2021-12-19 MED ORDER — TETANUS-DIPHTH-ACELL PERTUSSIS 5-2.5-18.5 LF-MCG/0.5 IM SUSY
0.5000 mL | PREFILLED_SYRINGE | Freq: Once | INTRAMUSCULAR | Status: AC
  Administered 2021-12-19: 0.5 mL via INTRAMUSCULAR
  Filled 2021-12-19: qty 0.5

## 2021-12-19 NOTE — Progress Notes (Signed)
Orthopedic Tech Progress Note Patient Details:  Isaac Kelly September 02, 1965 [id number]  Ortho Devices Type of Ortho Device: CAM walker Ortho Device/Splint Location: LLE Ortho Device/Splint Interventions: Ordered, Application, Adjustment   Post Interventions Patient Tolerated: Well Instructions Provided: Adjustment of device, Care of device  [name] 12/19/2021, 6:19 PM

## 2021-12-19 NOTE — ED Notes (Signed)
Dc instructions reviewed with pt. Pt verbalized understanding. Pt DC. 

## 2021-12-19 NOTE — ED Triage Notes (Signed)
Pt BIB [facility] d/t being hit in head by a 40-60lb roll of carpet that was tossed out of window from 4 stories up at [facility]..  Pt was in a dumpster style container full of old carpet when he was hit. LOC is unclear also it is unclear whether pt fell out of container but he does have pain and swelling to left ankle.  PT is in c-collar and ankle is splinted. PT complains of no neck or back pain.   V/S 160/80 100% RA R 90, cbg 150.

## 2021-12-19 NOTE — ED Provider Notes (Signed)
[place] EMERGENCY DEPARTMENT Provider Note   CSN: [id number] Arrival date & time: 12/19/21  1533     History  No chief complaint on file.   Isaac Kelly is a 56 y.o. male.  HPI Patient is a 56 year old male with a history of diabetes mellitus, hyperlipidemia, NSTEMI, who presents to the emergency department due to trauma that occurred prior to arrival.  Patient was working on the trailer of a truck while his coworker was stripping carpet and throwing it out of the window onto the trailer.  He states that about a 40 to 60 pound roll of carpet was thrown out which he was unaware of.  This fell about 4 stories before striking him over the head.  He then fell out of the trailer.  Unsure of LOC.  Does note an abrasion to the top of his head as well as pain and swelling to the left ankle and right-sided neck pain.  C-collar in place.  Denies any pain at the chest, abdomen, back, hips.  Denies being anticoagulated.    Home Medications Prior to Admission medications   Medication Sig Start Date End Date Taking? Authorizing Provider  aspirin EC 81 MG EC tablet Take 1 tablet (81 mg total) by mouth daily. Swallow whole. 10/15/20   Danford, Suann Larry, MD  atorvastatin (LIPITOR) 80 MG tablet Take 1 tablet (80 mg total) by mouth at bedtime. 10/15/20   Danford, Suann Larry, MD  clopidogrel (PLAVIX) 75 MG tablet Take 1 tablet (75 mg total) by mouth daily. 10/16/20   Danford, Suann Larry, MD  fenofibrate (TRICOR) 145 MG tablet Take 145 mg by mouth every evening.    [provider]  lisinopril (ZESTRIL) 5 MG tablet Take 1 tablet (5 mg total) by mouth daily. 10/16/20   Danford, Suann Larry, MD  metoprolol succinate (TOPROL-XL) 25 MG 24 hr tablet Take 1 tablet (25 mg total) by mouth daily. 10/16/20   Danford, Suann Larry, MD  nitroGLYCERIN (NITROSTAT) 0.4 MG SL tablet Place 1 tablet (0.4 mg total) under the tongue every 5 (five) minutes as needed for chest pain. 10/15/20    Danford, Suann Larry, MD  Omega 3 1000 MG CAPS Take 1 capsule by mouth daily.    [provider]  predniSONE (DELTASONE) 10 MG tablet Take 10-30 mg by mouth See admin instructions. Take 3 tablets (30mg ) by mouth daily for three days, take 2 tablets (20mg ) by mouth daily for 3 days then take 1 tablet (10mg ) by mouth daily for five days 10/08/20   [provider]      Allergies    Patient has no known allergies.    Review of Systems   Review of Systems  All other systems reviewed and are negative. Ten systems reviewed and are negative for acute change, except as noted in the HPI.   Physical Exam Updated Vital Signs BP 123/85   Pulse 81   Temp 98.2 F (36.8 C) (Oral)   Resp (!) 31   SpO2 97%  Physical Exam Vitals and nursing note reviewed.  Constitutional:      General: He is not in acute distress.    Appearance: Normal appearance. He is not ill-appearing, toxic-appearing or diaphoretic.  HENT:     Head: Normocephalic.     Comments: Abrasion noted to the superior scalp.  No active bleeding.    Right Ear: External ear normal.     Left Ear: External ear normal.     Nose: Nose normal.  Mouth/Throat:     Mouth: Mucous membranes are moist.     Pharynx: Oropharynx is clear. No oropharyngeal exudate or posterior oropharyngeal erythema.  Eyes:     General: No scleral icterus.       Right eye: No discharge.        Left eye: No discharge.     Extraocular Movements: Extraocular movements intact.     Conjunctiva/sclera: Conjunctivae normal.     Pupils: Pupils are equal, round, and reactive to light.     Comments: PERRL. EOMI.  Neck:     Comments: C-collar in place. Cardiovascular:     Rate and Rhythm: Normal rate and regular rhythm.     Pulses: Normal pulses.     Heart sounds: Normal heart sounds. No murmur heard.   No friction rub. No gallop.  Pulmonary:     Effort: Pulmonary effort is normal. No respiratory distress.     Breath sounds: Normal breath  sounds. No stridor. No wheezing, rhonchi or rales.     Comments: Lungs are clear to auscultation bilaterally with symmetric rise and fall the chest with inspiration and expiration.  No wheezing, rales, or rhonchi.  No anterior chest wall tenderness.  No flail chest.  No visible signs of trauma. Chest:     Chest wall: No tenderness.  Abdominal:     General: Abdomen is flat.     Palpations: Abdomen is soft.     Tenderness: There is no abdominal tenderness.     Comments: Protuberant abdomen that is soft and nontender in all 4 quadrants.  No visible signs of trauma.  Musculoskeletal:        General: Tenderness present. Normal range of motion.     Comments: Left ankle: Moderate TTP with associated soft tissue swelling noted overlying the medial malleolus.  Unable to assess range of motion due to patient's pain.  Distal sensation intact in the left foot.  Wiggling the toes without difficulty.  Good cap refill.  Full range of motion of the hips, knees, as well as the right ankle.  No other regions of palpable tenderness noted in all 4 extremities.  Skin:    General: Skin is warm and dry.  Neurological:     General: No focal deficit present.     Mental Status: He is alert and oriented to person, place, and time.     Comments: A&O x3.  Speaking clearly, coherently, and in complete sentences.  Moving all 4 extremities with ease.  No gross deficits.  Distal sensation intact.  Psychiatric:        Mood and Affect: Mood normal.        Behavior: Behavior normal.   ED Results / Procedures / Treatments   Labs (all labs ordered are listed, but only abnormal results are displayed) Labs Reviewed - No data to display  EKG None  Radiology DG Ankle Complete Left  Result Date: 12/19/2021 CLINICAL DATA:  Pain after fall EXAM: LEFT ANKLE COMPLETE - 3+ VIEW COMPARISON:  None Available. FINDINGS: Abundant medial greater than lateral soft tissue swelling. Multiple osseous fragments adjacent to the inferomedial  and inferolateral talus as well as the posterior process of the talus bone. Ankle mortise is symmetric. IMPRESSION: Multiple osseous fragments adjacent to the inferomedial, inferolateral and posterior aspect of the talus Electronically Signed   By: Jasmine PangKim  Fujinaga M.D.   On: 12/19/2021 16:17   CT HEAD WO CONTRAST (5MM)  Result Date: 12/19/2021 CLINICAL DATA:  Provided history: Head trauma, moderate/severe. Neck  pain, acute, no red flags. EXAM: CT HEAD WITHOUT CONTRAST CT CERVICAL SPINE WITHOUT CONTRAST TECHNIQUE: Multidetector CT imaging of the head and cervical spine was performed following the standard protocol without intravenous contrast. Multiplanar CT image reconstructions of the cervical spine were also generated. RADIATION DOSE REDUCTION: This exam was performed according to the departmental dose-optimization program which includes automated exposure control, adjustment of the mA and/or kV according to patient size and/or use of iterative reconstruction technique. COMPARISON:  None. FINDINGS: CT HEAD FINDINGS Brain: Cerebral volume is normal. Nonspecific 3 mm parenchymal calcification within the right temporoparietal junction (series 3, image 17). There is no acute intracranial hemorrhage. No demarcated cortical infarct. No extra-axial fluid collection. No evidence of an intracranial mass. No midline shift. Vascular: No hyperdense vessel. Atherosclerotic calcifications. Skull: No fracture or aggressive osseous lesion. Sinuses/Orbits: No mass or acute finding within the imaged orbits. No significant paranasal sinus disease. Other: Subcentimeter right parietal scalp lipoma. CT CERVICAL SPINE FINDINGS Alignment: Straightening of the expected cervical lordosis. No significant spondylolisthesis. Skull base and vertebrae: The basion-dental and atlanto-dental intervals are maintained.No evidence of acute fracture to the cervical spine. Soft tissues and spinal canal: No prevertebral fluid or swelling. No visible  canal hematoma. Disc levels: The intervertebral disc spaces are largely preserved. Shallow multilevel disc bulges. Levels with mild facet spurring. No appreciable high-grade spinal canal stenosis. No compressive bony neural foraminal narrowing. Upper chest: No consolidation within the imaged lung apices. No visible pneumothorax. IMPRESSION: CT head: No evidence of acute intracranial abnormality. CT cervical spine: 1. No evidence of acute fracture to the cervical spine. 2. Nonspecific straightening of the expected cervical lordosis. 3. Cervical spondylosis, as described. Electronically Signed   By: Kellie Simmering D.O.   On: 12/19/2021 17:03   CT Cervical Spine Wo Contrast  Result Date: 12/19/2021 CLINICAL DATA:  Provided history: Head trauma, moderate/severe. Neck pain, acute, no red flags. EXAM: CT HEAD WITHOUT CONTRAST CT CERVICAL SPINE WITHOUT CONTRAST TECHNIQUE: Multidetector CT imaging of the head and cervical spine was performed following the standard protocol without intravenous contrast. Multiplanar CT image reconstructions of the cervical spine were also generated. RADIATION DOSE REDUCTION: This exam was performed according to the departmental dose-optimization program which includes automated exposure control, adjustment of the mA and/or kV according to patient size and/or use of iterative reconstruction technique. COMPARISON:  None. FINDINGS: CT HEAD FINDINGS Brain: Cerebral volume is normal. Nonspecific 3 mm parenchymal calcification within the right temporoparietal junction (series 3, image 17). There is no acute intracranial hemorrhage. No demarcated cortical infarct. No extra-axial fluid collection. No evidence of an intracranial mass. No midline shift. Vascular: No hyperdense vessel. Atherosclerotic calcifications. Skull: No fracture or aggressive osseous lesion. Sinuses/Orbits: No mass or acute finding within the imaged orbits. No significant paranasal sinus disease. Other: Subcentimeter right  parietal scalp lipoma. CT CERVICAL SPINE FINDINGS Alignment: Straightening of the expected cervical lordosis. No significant spondylolisthesis. Skull base and vertebrae: The basion-dental and atlanto-dental intervals are maintained.No evidence of acute fracture to the cervical spine. Soft tissues and spinal canal: No prevertebral fluid or swelling. No visible canal hematoma. Disc levels: The intervertebral disc spaces are largely preserved. Shallow multilevel disc bulges. Levels with mild facet spurring. No appreciable high-grade spinal canal stenosis. No compressive bony neural foraminal narrowing. Upper chest: No consolidation within the imaged lung apices. No visible pneumothorax. IMPRESSION: CT head: No evidence of acute intracranial abnormality. CT cervical spine: 1. No evidence of acute fracture to the cervical spine. 2. Nonspecific straightening of the  expected cervical lordosis. 3. Cervical spondylosis, as described. Electronically Signed   By: Kellie Simmering D.O.   On: 12/19/2021 17:03    Procedures Procedures   Medications Ordered in ED Medications  Tdap (BOOSTRIX) injection 0.5 mL (has no administration in time range)   ED Course/ Medical Decision Making/ A&P                           Medical Decision Making Amount and/or Complexity of Data Reviewed Radiology: ordered.  Risk Prescription drug management.  Pt is a 56 y.o. male who presents to the emergency department for evaluation after being struck in the head with a roll of carpet while at work earlier today.  Imaging: X-ray of the left ankle shows multiple osseous fragments adjacent to the inferior medial, inferior lateral and posterior aspect of the talus.  CT scan of the head and cervical spine without contrast shows no evidence of acute intracranial abnormality.  No evidence of acute fracture to the cervical spine.  Nonspecific straightening of the expected cervical lordosis.  Cervical spondylosis as described above.  I,  Rayna Sexton, PA-C, personally reviewed and evaluated these images and lab results as part of my medical decision-making.  On my exam c-collar in place.  Patient has abrasion to the superior scalp with no active bleeding.  Pupils are equal, round, and reactive to light.  Extraocular movements are intact.  No tenderness appreciated to the midline thoracic or lumbar spine.  No anterior chest wall tenderness.  Abdomen is flat, soft, and nontender.  No visible signs of traum noted in the region.  Patient does have moderate tenderness and soft tissue swelling along the medial malleolus of the left ankle.  Given the mechanism of the injury obtained x-rays of the left ankle with additional CT scans of the head and cervical spine.  X-ray left ankle shows multiple osseous fragments in the inferior medial, inferior lateral, and posterior aspect of the talus.  I am concerned this is possibly a ligamentous injury in the region.  Due to this we will place patient in a cam boot for comfort.  Recommended continued use of Tylenol.  RICE method.  CT scans of the head and cervical spine for the pain reassuring.  C-collar removed.  Besides the abrasion on his head and the trauma noted to his left ankle, on reassessment patient is still denying any other regions of pain.  Patient unsure of the timing of his last Tdap this will be updated in the ED.  Patient appears stable for discharge at this time and he is agreeable.  We will give him a referral to orthopedics.  Discussed return precautions.  Offered patient a work note but he declined.  His questions were answered and he was amicable at the time of discharge.  Note: Portions of this report may have been transcribed using voice recognition software. Every effort was made to ensure accuracy; however, inadvertent computerized transcription errors may be present.   Final Clinical Impression(s) / ED Diagnoses Final diagnoses:  Acute left ankle pain  Injury of head,  initial encounter   Rx / DC Orders ED Discharge Orders     None         Rayna Sexton, PA-C 12/19/21 1747    Sherwood Gambler, MD 12/21/21 1601

## 2021-12-19 NOTE — Discharge Instructions (Addendum)
I would recommend taking Tylenol as needed for management of your pain.  Please follow the instructions on the bottle.  Please elevate and ice the ankle 1-2 times per day.  Please continue to wear your boot as needed for comfort and stability.  Also consider an Ace wrap around the ankle at night to help compress the ankle and hopefully reduce swelling as well.  Below is the contact information for Dr. Duwayne Heck.  He is a local orthopedic specialist.  Please give his office a call at the number below and schedule an appointment for reevaluation of your ankle.  If you develop any new or worsening symptoms whatsoever please come back to the emergency department.

## 2021-12-25 DIAGNOSIS — Z419 Encounter for procedure for purposes other than remedying health state, unspecified: Secondary | ICD-10-CM | POA: Diagnosis not present

## 2022-01-05 DIAGNOSIS — E782 Mixed hyperlipidemia: Secondary | ICD-10-CM | POA: Diagnosis not present

## 2022-01-08 DIAGNOSIS — S93422A Sprain of deltoid ligament of left ankle, initial encounter: Secondary | ICD-10-CM | POA: Diagnosis not present

## 2022-01-08 DIAGNOSIS — M25572 Pain in left ankle and joints of left foot: Secondary | ICD-10-CM | POA: Diagnosis not present

## 2022-01-22 ENCOUNTER — Ambulatory Visit (INDEPENDENT_AMBULATORY_CARE_PROVIDER_SITE_OTHER): Payer: Medicaid Other | Admitting: Family Medicine

## 2022-01-22 ENCOUNTER — Other Ambulatory Visit: Payer: Self-pay

## 2022-01-22 ENCOUNTER — Encounter: Payer: Self-pay | Admitting: Family Medicine

## 2022-01-22 VITALS — BP 128/93 | HR 91 | Wt 200.0 lb

## 2022-01-22 DIAGNOSIS — Z7689 Persons encountering health services in other specified circumstances: Secondary | ICD-10-CM

## 2022-01-22 DIAGNOSIS — I214 Non-ST elevation (NSTEMI) myocardial infarction: Secondary | ICD-10-CM | POA: Diagnosis not present

## 2022-01-22 NOTE — Progress Notes (Signed)
    SUBJECTIVE:   CHIEF COMPLAINT / HPI:   Patient presents for new appointment to establish care. Was previously seen at a walk in clinic with wife but insurance changed and they are now able to be seen here.   Lives with wife (who accompanies him to this visit) and 2 children. 1 child not at home  Does flooring and remodeling for work   Currently on crutches, fractured ankle at work  PMH:  Hyperlipidemia NSTEMI 2022. Follows with Cardiology Dr. Gwen Pounds   [facility] hiatal hernia about 17 years ago  Medication Atorvastatin 80mg , ASA, Lisinopril 10mg , Omega 3   No tobacco use, 1 glass of red wine, no rec drug use   Colonoscopy- about 3- 4 years ago. Had some polyps. Will try to get results    OBJECTIVE:   BP (!) 128/93   Pulse 91   Wt 200 lb (90.7 kg)   SpO2 96%   BMI 31.32 kg/m    Physical exam General: well appearing, NAD Cardiovascular: RRR, no murmurs Lungs: CTAB. Normal WOB Abdomen: soft, non-distended, non-tender Skin: warm, dry. No edema  ASSESSMENT/PLAN:   NSTEMI (non-ST elevated myocardial infarction) (HCC) Occurred in March 2022.  Coronary cath showed no thrombus, and no stent was placed.  Echo with EF of 50 to 55% and normal valves.  Started on aspirin and plavix, and now only takes aspirin and atorvastatin 80 mg, as well as lisinopril 10 mg.  Follows with cardiologist Dr. .   Encounter to establish care Reviewed PMH and medications. Wife is also a new patient of mine. No questions or concerns today. Patient had recent blood work done so no need to repeat. Has had 2 colonoscopies. States he had polyps on last one about 3-4 years ago. Discussed sending April 2022 results. Will follow up as needed.      Gwen Pounds, DO [facility]

## 2022-01-22 NOTE — Patient Instructions (Signed)
It was great meeting you today!  You came in to establish care and I am glad you are doing well! If you can get your colonoscopy results from your previous doctor and send it that would be great! The fax here is [phone].   Your wife's appointment is scheduled for 7/10 at 9:30 am   Feel free to call with any questions or concerns at any time, at [phone].   Take care,  Dr. Cora Collum [facility]

## 2022-01-22 NOTE — Assessment & Plan Note (Signed)
Occurred in March 2022.  Coronary cath showed no thrombus, and no stent was placed.  Echo with EF of 50 to 55% and normal valves.  Started on aspirin and plavix, and now only takes aspirin and atorvastatin 80 mg, as well as lisinopril 10 mg.  Follows with cardiologist Dr. Gwen Pounds.

## 2022-01-22 NOTE — Assessment & Plan Note (Addendum)
Reviewed PMH and medications. Wife is also a new patient of mine. No questions or concerns today. Patient had recent blood work done so no need to repeat. BP at goal. Has had 2 colonoscopies. States he had polyps on last one about 3-4 years ago. Discussed sending [place] results. Will follow up as needed.

## 2022-01-24 DIAGNOSIS — Z419 Encounter for procedure for purposes other than remedying health state, unspecified: Secondary | ICD-10-CM | POA: Diagnosis not present

## 2022-01-29 DIAGNOSIS — S93422D Sprain of deltoid ligament of left ankle, subsequent encounter: Secondary | ICD-10-CM | POA: Diagnosis not present

## 2022-02-23 DIAGNOSIS — M25572 Pain in left ankle and joints of left foot: Secondary | ICD-10-CM | POA: Diagnosis not present

## 2022-02-23 DIAGNOSIS — M25672 Stiffness of left ankle, not elsewhere classified: Secondary | ICD-10-CM | POA: Diagnosis not present

## 2022-02-24 DIAGNOSIS — Z419 Encounter for procedure for purposes other than remedying health state, unspecified: Secondary | ICD-10-CM | POA: Diagnosis not present

## 2022-03-05 DIAGNOSIS — M25572 Pain in left ankle and joints of left foot: Secondary | ICD-10-CM | POA: Diagnosis not present

## 2022-03-05 DIAGNOSIS — M25672 Stiffness of left ankle, not elsewhere classified: Secondary | ICD-10-CM | POA: Diagnosis not present

## 2022-03-18 ENCOUNTER — Ambulatory Visit: Payer: Medicaid Other | Admitting: Family Medicine

## 2022-03-18 DIAGNOSIS — M25672 Stiffness of left ankle, not elsewhere classified: Secondary | ICD-10-CM | POA: Diagnosis not present

## 2022-03-18 DIAGNOSIS — M25572 Pain in left ankle and joints of left foot: Secondary | ICD-10-CM | POA: Diagnosis not present

## 2022-03-20 DIAGNOSIS — M25672 Stiffness of left ankle, not elsewhere classified: Secondary | ICD-10-CM | POA: Diagnosis not present

## 2022-03-26 ENCOUNTER — Encounter: Payer: Self-pay | Admitting: Family Medicine

## 2022-03-26 ENCOUNTER — Ambulatory Visit (INDEPENDENT_AMBULATORY_CARE_PROVIDER_SITE_OTHER): Payer: Medicaid Other | Admitting: Family Medicine

## 2022-03-26 VITALS — BP 134/96 | HR 81 | Wt 200.6 lb

## 2022-03-26 DIAGNOSIS — K625 Hemorrhage of anus and rectum: Secondary | ICD-10-CM

## 2022-03-26 DIAGNOSIS — K6289 Other specified diseases of anus and rectum: Secondary | ICD-10-CM | POA: Diagnosis not present

## 2022-03-26 DIAGNOSIS — Z86010 Personal history of colonic polyps: Secondary | ICD-10-CM | POA: Diagnosis not present

## 2022-03-26 DIAGNOSIS — M25572 Pain in left ankle and joints of left foot: Secondary | ICD-10-CM | POA: Diagnosis not present

## 2022-03-26 DIAGNOSIS — K644 Residual hemorrhoidal skin tags: Secondary | ICD-10-CM | POA: Diagnosis not present

## 2022-03-26 MED ORDER — POLYETHYLENE GLYCOL 3350 17 GM/SCOOP PO POWD: 17.0000 g | Freq: Two times a day (BID) | ORAL | 1 refills | Status: DC | PRN

## 2022-03-26 MED ORDER — HYDROCORTISONE ACETATE 25 MG RE SUPP: 25.0000 mg | Freq: Two times a day (BID) | RECTAL | 0 refills | Status: DC

## 2022-03-26 NOTE — Assessment & Plan Note (Signed)
Anal pain and burning with and after defecation most consistent with external hemorrhoids as seen on rectal exam. However, patient did have colonoscopy with 2 polyps in 2017 which recommended follow up in 3 years.  - Refer for diagnostic colonoscopy given bleeding and previous colonoscopy  - Anusol HC suppository , sitz baths, preparation H cream  - Miralax 1-2 times per day

## 2022-03-26 NOTE — Progress Notes (Signed)
    SUBJECTIVE:   CHIEF COMPLAINT / HPI:   Anal pain and bleeding   Has been having pain in the anal area for about 1 month and half. Pain begins when he has a bowel movement and lasts for a couple hours thereafter. He has pain and burning. He has a bowel movement everyday occassionally twice a day. Patient reports bowel movements [place] 4 occasionally 3. He feels as though stool is very hard. He reports having to strain intensely to pass stool. He feels as though he has had to strain for the past month and half as well. Denies blood in the toilet. Says rarely he will rarely have blood when he wipes after stooling.  Has not tried anything like miralax. He was prescribed a cream in [place] a month ago. He tried the cream for about one week without any changes in symptoms.   PERTINENT  PMH / PSH: Colonoscopy with polyps removed in 2017 - evidence of internal hemorrhoids at that time. Colonoscopy reports said to follow up in 3 years from that time.   No history of colon cancer in the family   OBJECTIVE:   BP (!) 134/96   Pulse 81   Wt 200 lb 9.6 oz (91 kg)   SpO2 97%   BMI 31.42 kg/m   General - well appearing but uncomfortable  CV: well perfused  Resp: Normal work of breathing on room air  Abd: soft, non distended, non tender  Rectal: Small external hemorrhoid visible and tender to palpation of outer anal area, no blood on external exam, no other masses   ASSESSMENT/PLAN:   Anal pain Anal pain and burning with and after defecation most consistent with external hemorrhoids as seen on rectal exam. However, patient did have colonoscopy with 2 polyps in 2017 which recommended follow up in 3 years.  - Refer for diagnostic colonoscopy given bleeding and previous colonoscopy  - Anusol HC suppository , sitz baths, preparation H cream  - Miralax 1-2 times per day      Lockie Mola, MD [facility]

## 2022-03-26 NOTE — Patient Instructions (Signed)
It was great to see you today! Thank you for choosing [facility] for your primary care. Isaac Kelly was seen for anal pain and bleeding.  Today we addressed: Anal pain - I believe you have external hemorrhoids that are causing you pain. I prescribed you a suppository that should help the pain. Please also do Sitz baths and you can you Preparation H cream over the counter. Please also use miralax once or twice daily to prevent worsening of your hemorrhoids by helping your constipation. Your last colonoscopy said to follow up in 3 years which would have been 2020. I referred you for a colonoscopy to make sure your bleeding is not for some other cause and to follow up on your previous colonoscopy.   If you haven't already, sign up for My Chart to have easy access to your labs results, and communication with your primary care physician.  We are checking some labs today. If they are abnormal, I will call you. If they are normal, I will send you a MyChart message (if it is active) or a letter in the mail. If you do not hear about your labs in the next 2 weeks, please call the office.   You should return to our clinic No follow-ups on file.  I recommend that you always bring your medications to each appointment as this makes it easy to ensure you are on the correct medications and helps [place] not miss refills when you need them.  Please arrive 15 minutes before your appointment to ensure smooth check in process.  We appreciate your efforts in making this happen.  Please call the clinic at [phone] if your symptoms worsen or you have any concerns.  Thank you for allowing me to participate in your care, Lockie Mola, MD 03/26/2022, 11:42 AM PGY-1, [facility]

## 2022-03-27 DIAGNOSIS — Z419 Encounter for procedure for purposes other than remedying health state, unspecified: Secondary | ICD-10-CM | POA: Diagnosis not present

## 2022-04-01 DIAGNOSIS — M25672 Stiffness of left ankle, not elsewhere classified: Secondary | ICD-10-CM | POA: Diagnosis not present

## 2022-04-01 DIAGNOSIS — M25572 Pain in left ankle and joints of left foot: Secondary | ICD-10-CM | POA: Diagnosis not present

## 2022-04-08 DIAGNOSIS — I214 Non-ST elevation (NSTEMI) myocardial infarction: Secondary | ICD-10-CM | POA: Diagnosis not present

## 2022-04-08 DIAGNOSIS — I10 Essential (primary) hypertension: Secondary | ICD-10-CM | POA: Diagnosis not present

## 2022-04-08 DIAGNOSIS — E782 Mixed hyperlipidemia: Secondary | ICD-10-CM | POA: Diagnosis not present

## 2022-04-08 DIAGNOSIS — I2510 Atherosclerotic heart disease of native coronary artery without angina pectoris: Secondary | ICD-10-CM | POA: Diagnosis not present

## 2022-04-26 DIAGNOSIS — Z419 Encounter for procedure for purposes other than remedying health state, unspecified: Secondary | ICD-10-CM | POA: Diagnosis not present

## 2022-05-27 DIAGNOSIS — Z419 Encounter for procedure for purposes other than remedying health state, unspecified: Secondary | ICD-10-CM | POA: Diagnosis not present

## 2022-05-29 DIAGNOSIS — M25572 Pain in left ankle and joints of left foot: Secondary | ICD-10-CM | POA: Diagnosis not present

## 2022-06-03 ENCOUNTER — Other Ambulatory Visit: Payer: Self-pay | Admitting: Orthopaedic Surgery

## 2022-06-03 DIAGNOSIS — M25572 Pain in left ankle and joints of left foot: Secondary | ICD-10-CM | POA: Diagnosis not present

## 2022-06-15 ENCOUNTER — Ambulatory Visit
Admission: RE | Admit: 2022-06-15 | Discharge: 2022-06-15 | Disposition: A | Discharge status: Home / Self Care | Payer: Medicaid Other | Source: Ambulatory Visit | Attending: Orthopaedic Surgery | Admitting: Orthopaedic Surgery

## 2022-06-15 DIAGNOSIS — M7989 Other specified soft tissue disorders: Secondary | ICD-10-CM | POA: Diagnosis not present

## 2022-06-15 DIAGNOSIS — M25572 Pain in left ankle and joints of left foot: Secondary | ICD-10-CM

## 2022-06-22 DIAGNOSIS — M25572 Pain in left ankle and joints of left foot: Secondary | ICD-10-CM | POA: Diagnosis not present

## 2022-06-22 DIAGNOSIS — S92102A Unspecified fracture of left talus, initial encounter for closed fracture: Secondary | ICD-10-CM | POA: Diagnosis not present

## 2022-06-26 DIAGNOSIS — Z419 Encounter for procedure for purposes other than remedying health state, unspecified: Secondary | ICD-10-CM | POA: Diagnosis not present

## 2022-07-22 DIAGNOSIS — Z86010 Personal history of colonic polyps: Secondary | ICD-10-CM | POA: Diagnosis not present

## 2022-07-22 DIAGNOSIS — D123 Benign neoplasm of transverse colon: Secondary | ICD-10-CM | POA: Diagnosis not present

## 2022-07-22 DIAGNOSIS — Z1211 Encounter for screening for malignant neoplasm of colon: Secondary | ICD-10-CM | POA: Diagnosis not present

## 2022-07-22 DIAGNOSIS — K635 Polyp of colon: Secondary | ICD-10-CM | POA: Diagnosis not present

## 2022-07-27 DIAGNOSIS — Z419 Encounter for procedure for purposes other than remedying health state, unspecified: Secondary | ICD-10-CM | POA: Diagnosis not present

## 2022-07-29 DIAGNOSIS — M25572 Pain in left ankle and joints of left foot: Secondary | ICD-10-CM | POA: Diagnosis not present

## 2022-08-27 DIAGNOSIS — Z419 Encounter for procedure for purposes other than remedying health state, unspecified: Secondary | ICD-10-CM | POA: Diagnosis not present

## 2022-09-25 DIAGNOSIS — Z419 Encounter for procedure for purposes other than remedying health state, unspecified: Secondary | ICD-10-CM | POA: Diagnosis not present

## 2022-10-26 DIAGNOSIS — Z419 Encounter for procedure for purposes other than remedying health state, unspecified: Secondary | ICD-10-CM | POA: Diagnosis not present

## 2022-11-09 ENCOUNTER — Telehealth: Payer: Self-pay | Admitting: Family Medicine

## 2022-11-09 NOTE — Telephone Encounter (Signed)
Contacted patient to help him schedule apt with PCP. Patient states he can schedule his own appointment and declined to schedule. AS, CMA

## 2022-11-11 DIAGNOSIS — M25572 Pain in left ankle and joints of left foot: Secondary | ICD-10-CM | POA: Diagnosis not present

## 2022-11-25 ENCOUNTER — Telehealth: Payer: Self-pay

## 2022-11-25 DIAGNOSIS — Z419 Encounter for procedure for purposes other than remedying health state, unspecified: Secondary | ICD-10-CM | POA: Diagnosis not present

## 2022-11-25 NOTE — Telephone Encounter (Signed)
LVM for patient to call back to schedule apt. AS, CMA 

## 2022-12-26 DIAGNOSIS — Z419 Encounter for procedure for purposes other than remedying health state, unspecified: Secondary | ICD-10-CM | POA: Diagnosis not present

## 2022-12-30 DIAGNOSIS — M25572 Pain in left ankle and joints of left foot: Secondary | ICD-10-CM | POA: Diagnosis not present

## 2023-01-25 DIAGNOSIS — Z419 Encounter for procedure for purposes other than remedying health state, unspecified: Secondary | ICD-10-CM | POA: Diagnosis not present

## 2023-02-25 DIAGNOSIS — Z419 Encounter for procedure for purposes other than remedying health state, unspecified: Secondary | ICD-10-CM | POA: Diagnosis not present

## 2023-03-25 ENCOUNTER — Encounter: Payer: Self-pay | Admitting: Family Medicine

## 2023-03-25 ENCOUNTER — Ambulatory Visit (INDEPENDENT_AMBULATORY_CARE_PROVIDER_SITE_OTHER): Payer: Medicaid Other | Admitting: Family Medicine

## 2023-03-25 VITALS — BP 133/89 | HR 91 | Ht 67.0 in | Wt 198.8 lb

## 2023-03-25 DIAGNOSIS — M25572 Pain in left ankle and joints of left foot: Secondary | ICD-10-CM | POA: Diagnosis not present

## 2023-03-25 DIAGNOSIS — I214 Non-ST elevation (NSTEMI) myocardial infarction: Secondary | ICD-10-CM | POA: Diagnosis not present

## 2023-03-25 DIAGNOSIS — R7303 Prediabetes: Secondary | ICD-10-CM | POA: Diagnosis not present

## 2023-03-25 DIAGNOSIS — I252 Old myocardial infarction: Secondary | ICD-10-CM | POA: Insufficient documentation

## 2023-03-25 DIAGNOSIS — Z0000 Encounter for general adult medical examination without abnormal findings: Secondary | ICD-10-CM

## 2023-03-25 LAB — POCT GLYCOSYLATED HEMOGLOBIN (HGB A1C): Hemoglobin A1C: 5.7 % — AB (ref 4.0–5.6)

## 2023-03-25 MED ORDER — LISINOPRIL 10 MG PO TABS: 10.0000 mg | ORAL_TABLET | Freq: Every day | ORAL | 1 refills | Status: DC

## 2023-03-25 NOTE — Assessment & Plan Note (Addendum)
Patient with history of L talus fracture in 05/23. Patient reports he was followed up by [facility] who report there was no surgical intervention, though I cannot find this encounter.  Patient reports that pain is much better controlled than when it first started, however he still has difficulty working as he finds it difficult to bend his ankle at work. On PE, swelling to the lateral ankle with full ROM.  - will refer to sports medicine - discussed pain control with voltaren gel and NSAIDS/tylenol

## 2023-03-25 NOTE — Progress Notes (Signed)
    SUBJECTIVE:   Chief compliant/HPI: annual examination  Isaac Kelly is a 57 y.o. who presents today for an annual exam.   History tabs reviewed and updated.   Review of systems form reviewed and notable for left ankle pain.   OBJECTIVE:   BP 133/89   Pulse 91   Ht 5\' 7"  (1.702 m)   Wt 198 lb 12.8 oz (90.2 kg)   SpO2 95%   BMI 31.14 kg/m    General: A&O, NAD HEENT: No sign of trauma, EOM grossly intact Cardiac: RRR, no m/r/g Respiratory: CTAB, normal WOB, no w/c/r GI: Soft, NTTP, non-distended  Extremities: NTTP, no peripheral edema. MSK: Swelling without effusion at L ankle. Not tender to touch. ROM complete, though patient endorses discomfort with plantarflexion.  Neuro: Normal gait, moves all four extremities appropriately. Psych: Appropriate mood and affect   ASSESSMENT/PLAN:   History of non-ST elevation myocardial infarction (NSTEMI) Occurred in March 2022. Coronary cath showed no thrombus, and no stent was placed. Echo with EF of 50 to 55% and normal valves. Started on aspirin and plavix, and now only takes aspirin and atorvastatin 80 mg, as well as lisinopril 10 mg.  - continue current medications - Lipid panel and BMP ordered   Left ankle pain Patient with history of L talus fracture in 05/23. Patient reports he was followed up by [facility] who report there was no surgical intervention, though I cannot find this encounter.  Patient reports that pain is much better controlled than when it first started, however he still has difficulty working as he finds it difficult to bend his ankle at work. On PE, swelling to the lateral ankle with full ROM.  - will refer to sports medicine - discussed pain control with voltaren gel and NSAIDS/tylenol   Annual Examination  See AVS for age appropriate recommendations.  PHQ score 0, reviewed and discussed.  Blood pressure value is at goal, discussed.   Considered the following screening exams based upon  USPSTF recommendations: Diabetes screening: discussed, A1c 5.6  Screening for elevated cholesterol: ordered HIV testing:  declined Hepatitis C:  declined Reviewed risk factors for latent tuberculosis and not indicated Colorectal cancer screening: up to date on screening for CRC. Patient found to have adenomatous polyp on last colonoscopy in 11/23. Will need another colonoscopy every 3 years.  Lung cancer screening: discussed See documentation below regarding discussion and indication.  Follow up in 1 year or sooner if indicated.    Hal Morales, MD [facility]

## 2023-03-25 NOTE — Assessment & Plan Note (Addendum)
Occurred in March 2022. Coronary cath showed no thrombus, and no stent was placed. Echo with EF of 50 to 55% and normal valves. Started on aspirin and plavix, and now only takes aspirin and atorvastatin 80 mg, as well as lisinopril 10 mg.  - continue current medications - Lipid panel and BMP ordered

## 2023-03-25 NOTE — Patient Instructions (Addendum)
It was wonderful to see you today.  Please bring ALL of your medications with you to every visit.   Today we talked about:  Your history of of heart attack.  Please continue to take your statin and lisinopril.  I sent a refill for the lisinopril to your pharmacy.  Your ankle pain, I sent a referral to sports medicine. Please be on the look out for information regarding that referral.  Your A1C to measure your blood sugar was okay. We will follow up at this at your next visit.   Thank you for choosing [facility].   Please call [phone] with any questions about today's appointment.  Please arrive at least 15 minutes prior to your scheduled appointments.   If you had blood work today, I will send you a MyChart message or a letter if results are normal. Otherwise, I will give you a call.   If you had a referral placed, they will call you to set up an appointment. Please give [place] a call if you don't hear back in the next 2 weeks.   If you need additional refills before your next appointment, please call your pharmacy first.   Hal Morales, MD Family Medicine

## 2023-03-26 LAB — LIPID PANEL
Chol/HDL Ratio: 3.2 ratio (ref 0.0–5.0)
Cholesterol, Total: 172 mg/dL (ref 100–199)
HDL: 53 mg/dL (ref >39)
LDL Chol Calc (NIH): 95 mg/dL (ref 0–99)
Triglycerides: 135 mg/dL (ref 0–149)
VLDL Cholesterol Cal: 24 mg/dL (ref 5–40)

## 2023-03-26 LAB — BASIC METABOLIC PANEL
BUN/Creatinine Ratio: 18 (ref 9–20)
BUN: 18 mg/dL (ref 6–24)
CO2: 22 mmol/L (ref 20–29)
Calcium: 9.6 mg/dL (ref 8.7–10.2)
Chloride: 106 mmol/L (ref 96–106)
Creatinine, Ser: 1.01 mg/dL (ref 0.76–1.27)
Glucose: 91 mg/dL (ref 70–99)
Potassium: 4.3 mmol/L (ref 3.5–5.2)
Sodium: 144 mmol/L (ref 134–144)
eGFR: 86 mL/min/{1.73_m2} (ref >59)

## 2023-03-28 DIAGNOSIS — Z419 Encounter for procedure for purposes other than remedying health state, unspecified: Secondary | ICD-10-CM | POA: Diagnosis not present

## 2023-04-06 ENCOUNTER — Ambulatory Visit: Payer: Medicaid Other | Admitting: Sports Medicine

## 2023-04-27 DIAGNOSIS — Z419 Encounter for procedure for purposes other than remedying health state, unspecified: Secondary | ICD-10-CM | POA: Diagnosis not present

## 2023-05-10 ENCOUNTER — Other Ambulatory Visit: Payer: Self-pay | Admitting: Family Medicine

## 2023-05-10 NOTE — Telephone Encounter (Signed)
Patient walked in and would like to request his refills for his lipitor. He has been without it for 3 days. The refill request was recently sent to his cardiologist instead of PCP. He would like to have this filled as soon as possible.   Please call patient and let him know when refill has been sent to pharmacy. [phone]

## 2023-05-12 MED ORDER — ATORVASTATIN CALCIUM 80 MG PO TABS: 80.0000 mg | ORAL_TABLET | Freq: Every day | ORAL | 3 refills | Status: DC

## 2023-05-12 NOTE — Telephone Encounter (Signed)
Patient calls nurse line in regards to Atorvastatin request.  He reports he is no longer seeing his Cardiologist, he reports "they released me."   Patient reports he was told going forward my PCP would have to fill.   Patient last saw PCP on 8/29.  Will forward to PCP for Atorvastatin request.   He is requesting a 90 supply.

## 2023-05-12 NOTE — Addendum Note (Signed)
Addended by: Steva Colder on: 05/12/2023 09:59 AM   Modules accepted: Orders

## 2023-05-28 DIAGNOSIS — Z419 Encounter for procedure for purposes other than remedying health state, unspecified: Secondary | ICD-10-CM | POA: Diagnosis not present

## 2023-06-09 ENCOUNTER — Ambulatory Visit: Payer: Medicaid Other | Admitting: Student

## 2023-06-09 ENCOUNTER — Other Ambulatory Visit (INDEPENDENT_AMBULATORY_CARE_PROVIDER_SITE_OTHER): Payer: Self-pay | Admitting: Pharmacist

## 2023-06-09 DIAGNOSIS — Z0000 Encounter for general adult medical examination without abnormal findings: Secondary | ICD-10-CM | POA: Diagnosis not present

## 2023-06-09 MED ORDER — ATORVASTATIN CALCIUM 80 MG PO TABS: 80.0000 mg | ORAL_TABLET | Freq: Every day | ORAL | 1 refills | Status: DC

## 2023-06-09 NOTE — Patient Instructions (Signed)
Take atorvastatin 80mg  1 tablet daily.

## 2023-06-09 NOTE — Progress Notes (Signed)
Patient present outside of Family Medicine during evacuation from fire alarm.  Requested refill of atorvastatin 80mg .  Stated he had only 1 dose remaining and needed a refill today.   Showed bottle and appears adherent with therapy.  Denies any side effects.   Agreed to assist with refill request.   Patient has visit with PCP, Dr. Kurtis Bushman in 5 days.  New prescription refill provided.    Total time with patient call and documentation of interaction: 8 minutes.

## 2023-06-10 NOTE — Progress Notes (Signed)
Reviewed and agree with Dr Koval's plan.   

## 2023-06-14 ENCOUNTER — Ambulatory Visit (INDEPENDENT_AMBULATORY_CARE_PROVIDER_SITE_OTHER): Payer: Medicaid Other | Admitting: Family Medicine

## 2023-06-14 ENCOUNTER — Encounter: Payer: Self-pay | Admitting: Family Medicine

## 2023-06-14 VITALS — BP 118/80 | HR 96 | Ht 67.0 in | Wt 202.4 lb

## 2023-06-14 DIAGNOSIS — I252 Old myocardial infarction: Secondary | ICD-10-CM | POA: Diagnosis not present

## 2023-06-14 DIAGNOSIS — I10 Essential (primary) hypertension: Secondary | ICD-10-CM | POA: Insufficient documentation

## 2023-06-14 DIAGNOSIS — I214 Non-ST elevation (NSTEMI) myocardial infarction: Secondary | ICD-10-CM

## 2023-06-14 DIAGNOSIS — M25511 Pain in right shoulder: Secondary | ICD-10-CM | POA: Insufficient documentation

## 2023-06-14 DIAGNOSIS — M25572 Pain in left ankle and joints of left foot: Secondary | ICD-10-CM | POA: Diagnosis not present

## 2023-06-14 DIAGNOSIS — G8929 Other chronic pain: Secondary | ICD-10-CM | POA: Diagnosis not present

## 2023-06-14 MED ORDER — LISINOPRIL 10 MG PO TABS: 10.0000 mg | ORAL_TABLET | Freq: Every day | ORAL | 2 refills | Status: DC

## 2023-06-14 MED ORDER — ATORVASTATIN CALCIUM 80 MG PO TABS
80.0000 mg | ORAL_TABLET | Freq: Every day | ORAL | 3 refills | Status: AC
Start: 2023-06-14 — End: ?

## 2023-06-14 NOTE — Patient Instructions (Addendum)
It was wonderful to see you today.  Please bring ALL of your medications with you to every visit.   Today we talked about:  Your ankle and shoulder pain. I will call the sports medicine clinic to see if we can reschedule your appointment.   Please try Voltaren gel for your pain. You can apply this to the affected area up to 3 times a day.   Thank you for choosing [facility].   Please call [phone] with any questions about today's appointment.  Please arrive at least 15 minutes prior to your scheduled appointments.   If you had blood work today, I will send you a MyChart message or a letter if results are normal. Otherwise, I will give you a call.   If you had a referral placed, they will call you to set up an appointment. Please give [place] a call if you don't hear back in the next 2 weeks.   If you need additional refills before your next appointment, please call your pharmacy first.   Hal Morales, MD Family Medicine    Desgarro del manguito rotador Rotator Cuff Tear  Un desgarro del manguito rotador es un desgarro total o parcial de las bandas similares a cuerdas (tendones) que unen los msculos con el hueso en el manguito rotador. El [employer] rotador es un grupo de msculos y tendones que rodea la articulacin del hombro y sostiene el hueso del brazo superior (hmero) en la cavidad del hombro. El desgarro puede ocurrir de Wellsite geologist sbita (desgarro agudo) o se puede manifestar durante un perodo prolongado (desgarro crnico). Cules son las causas? Las causas de los desgarros agudos pueden ser, [place], las siguientes: [place], especficamente sobre un brazo extendido. Levantar objetos muy pesados con un movimiento brusco. La causa de los desgarros crnicos puede ser el uso excesivo de los msculos. Los desgarros de este tipo pueden ser consecuencia de la realizacin de deportes, trabajo fsico o actividades que requieren un movimiento repetitivo del brazo por  encima de la cabeza. Qu incrementa el riesgo? Es ms probable que esta afeccin ocurra en: Deportistas y trabajadores que usan con frecuencia el hombro o extienden el brazo por encima de la cabeza. Esto puede incluir [address], entre [place], las siguientes: [place]. Bisbol y sftbol. Natacin y remo. Levantar peso. Trabajo de construccin. Trabajo de pintura. Las [employer]. Personas mayores que tienen artritis o una deficiencia en la irrigacin sangunea. Estas afecciones pueden debilitar los msculos y los tendones. Cules son los signos o sntomas? Los sntomas de esta afeccin dependen del tipo y la gravedad de la lesin: Un desgarro agudo puede incluir una sensacin repentina de desgarro seguida de dolor intenso que comienza en la parte superior del hombro y baja por el brazo hacia el codo. Un desgarro crnico incluye debilidad gradual y disminucin del movimiento del hombro a Geographical information systems officer. El dolor generalmente empeora a la noche. Algunos sntomas que pueden aparecer con [employer] tipos de desgarro: Dolor que se extiende (irradia) desde el hombro hasta la parte superior del brazo. Dolor y Engineer, mining a la palpacin en la zona anterior del hombro. Disminucin de la amplitud de movimientos. Dolor al realizar lo siguiente: Extender, tirar o levantar el brazo por encima de la cabeza. Bajar el brazo desde una posicin encima de la cabeza. Imposibilidad de elevar el brazo [employer]. Dificultad para colocar el brazo detrs de la espalda. Cmo se diagnostica? Esta afeccin se diagnostica mediante una revisin de los  antecedentes mdicos y un examen fsico. Se pueden realizar estudios de diagnstico por imgenes, por ejemplo: Radiografas. Resonancia magntica (RM). Una ecografa. Tomografa computada (TC) o artrografa por RM. Durante este estudio, se Insurance account manager de contraste en el hombro y luego se toman las imgenes. Cmo se trata? El tratamiento  de esta afeccin depende del tipo y la gravedad. En casos no tan graves, el tratamiento puede incluir: Reposo. Esto se puede lograr con un cabestrillo que mantiene el hombro fijo (inmovilizacin). El mdico tambin puede recomendarle que evite realizar actividades que requieran levantar el brazo por encima de la cabeza. Aplicar hielo en el hombro. Medicamentos antiinflamatorios, como aspirina o ibuprofeno. Ejercicios de fortalecimiento y elongacin. Es posible que su mdico le recomiende hacer ejercicios especficos para mejorar la amplitud de movimiento y Company secretary hombro. Cuando los casos son ms graves, el tratamiento puede incluir: Fisioterapia. Inyecciones de corticoesteroides. [place]. Siga estas instrucciones en su casa: Control del dolor, la rigidez y la hinchazn     Si se lo indican, aplique hielo sobre la zona de la lesin. Para hacer esto: Si tiene un cabestrillo desmontable, quteselo como se lo haya indicado el mdico. Ponga el hielo en una bolsa plstica. Coloque una toalla entre la piel y Copy. Aplique el hielo durante 20 minutos, 2 o 3 veces por da. Retire el hielo si la piel se pone de color rojo brillante. Esto es [employer]. Si no puede sentir dolor, calor o fro, tiene un mayor riesgo de que se dae la zona. Mantenga la zona de la lesin levantada (elevada) por encima del nivel del corazn mientras est acostado. Busque una posicin cmoda para dormir o duerma sobre un silln reclinable, si fuera posible. Mueva los dedos con frecuencia para reducir la rigidez y la hinchazn. Una vez que desaparezca la inflamacin, el mdico puede indicarle que se aplique calor para relajar los msculos. Use la fuente de calor que el mdico le recomiende, como una compresa de calor hmedo o una almohadilla trmica. Coloque una toalla entre la piel y la fuente de Airline pilot. Aplique calor durante 20 a 30 minutos. Retire la fuente de calor si la piel se pone de color rojo  brillante. Esto es especialmente importante si no puede sentir dolor, calor o fro. Corre un mayor riesgo de sufrir quemaduras. Si tiene un cabestrillo: selo como se lo haya indicado el mdico. Quteselo solamente como se lo haya indicado el mdico. Afloje el cabestrillo si los dedos de la mano se le entumecen, siente hormigueo o se le enfran y se ponen azules. Mantenga el cabestrillo limpio. Si el cabestrillo no es impermeable: No deje que se moje. Cbralo con un envoltorio hermtico cuando tome un bao de inmersin o una ducha. Actividad Mantenga el hombro en reposo como se lo haya indicado el mdico. Retome sus actividades normales segn lo indicado por el mdico. Pregntele al mdico qu actividades son seguras para usted. Pregntele al mdico cundo podr volver a conducir si tiene un English as a second language teacher. Haga ejercicios o estiramientos como se lo haya indicado el mdico. Instrucciones generales Use los medicamentos de venta libre y los recetados solamente como se lo haya indicado el mdico. No consuma ningn producto que contenga nicotina o tabaco, como cigarrillos, cigarrillos electrnicos y tabaco de Theatre manager. Si necesita ayuda para dejar de fumar, consulte al mdico. Cumpla con todas las visitas de seguimiento. Esto es importante. Comunquese con un mdico si: El Product/process development scientist. Siente un dolor nuevo en el brazo, las manos o  los dedos. Los medicamentos no Tourist information centre manager. Solicite ayuda de inmediato si: El brazo, la mano o los dedos estn adormecidos o siente hormigueos. El brazo, la mano o los dedos estn hinchados, le duelen o se ven blancos o [place]. La mano o los dedos del brazo lesionado estn ms fros que la [place]. Resumen Un desgarro del manguito rotador es un desgarro total o parcial de las bandas similares a cuerdas (tendones) que unen los msculos con el hueso en el manguito rotador. El desgarro puede ocurrir de Wellsite geologist sbita (desgarro agudo) o se puede  manifestar durante un perodo prolongado (desgarro crnico). El tratamiento generalmente incluye reposo, medicamentos antiinflamatorios y aplicacin de hielo. En algunos casos, puede ser necesario realizar sesiones de fisioterapia y recibir inyecciones de corticoesteroides. En casos graves, tal vez haya que realizar [place]. Esta informacin no tiene Theme park manager el consejo del mdico. Asegrese de hacerle al mdico cualquier pregunta que tenga. Document Revised: 01/22/2020 Document Reviewed: 01/22/2020 Elsevier Patient Education  2024 [employer].

## 2023-06-14 NOTE — Assessment & Plan Note (Signed)
Patient wondering if he still needs to be on statin. Given history, recommended patient stay on this medication. - refilled statin

## 2023-06-14 NOTE — Assessment & Plan Note (Addendum)
Patient still experiencing this pain (since 2022). Was evaluated by [facility] who recommended conservative management vs surgery. Patient opted out of surgery d/t long recovery time ( 6 months) Was supposed to see sports medicine but missed appointment because he was not given an address. Would like to see them still.  - spoke to front desk who stated they would reschedule this appointment for patient.  - Voltaren gel prn

## 2023-06-14 NOTE — Assessment & Plan Note (Signed)
WNL at today's visit. Patient would like to stop taking this medication eventually, but given history of NSTEMI with recent BP elevated (133/90), will continue at this time.

## 2023-06-14 NOTE — Assessment & Plan Note (Signed)
Patient reports right shoulder pain intermittently when working. 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 Patient does not have any pain right now so unable to assess deficits at this time. Full ROM on exam. Given history, likely rotator ruff tendinitis. Imaging not indicated at this time. - voltaren gel prn - sent print out with exercises and conservative therapy with ice/heat - Patient to see sports medicine

## 2023-06-14 NOTE — Progress Notes (Signed)
    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 Would like to see them.   PERTINENT  PMH / PSH:  - NSTEMI - March 2022, followed by cardiology   OBJECTIVE:   BP 118/80   Pulse 96   Ht 5\' 7"  (1.702 m)   Wt 202 lb 6.4 oz (91.8 kg)   SpO2 96%   BMI 31.70 kg/m   General: A&O, NAD HEENT: No sign of trauma, EOM grossly intact Cardiac: RRR, no m/r/g Respiratory: CTAB, normal WOB, no w/c/r GI: Soft, NTTP, non-distended  Extremities: NTTP, no peripheral edema. Full ROM to right shoulder. Strength 5/5 to bilaterally upper extremities.  Neuro: Normal gait, moves all four extremities appropriately. Psych: Appropriate mood and affect  ASSESSMENT/PLAN:   Left ankle pain Patient still experiencing this pain (since 2022). Was evaluated by [facility] who recommended conservative management vs surgery. Patient opted out of surgery d/t long recovery time ( 6 months) Was supposed to see sports medicine but missed appointment because he was not given an address. Would like to see them still.  - spoke to front desk who stated they would reschedule this appointment for patient.  - Voltaren gel prn   Right shoulder pain Patient reports right shoulder pain intermittently when working. 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 Patient does not have any pain right now so unable to assess deficits at this time. Full ROM on exam. Given history, likely rotator ruff tendinitis. Imaging not indicated at this time. - voltaren gel prn - sent print out with exercises and conservative therapy with ice/heat - Patient to see sports medicine    History of non-ST elevation myocardial infarction (NSTEMI) Patient wondering if he still needs to be on statin. Given history, recommended patient stay on this medication. - refilled  statin   Hypertension WNL at today's visit. Patient would like to stop taking this medication eventually, but given history of NSTEMI with recent BP elevated (133/90), will continue at this time.     Hal Morales, MD [facility]

## 2023-06-17 ENCOUNTER — Ambulatory Visit: Payer: Medicaid Other | Admitting: Family Medicine

## 2023-06-17 ENCOUNTER — Encounter: Payer: Self-pay | Admitting: Family Medicine

## 2023-06-17 VITALS — BP 126/88 | Ht 67.0 in | Wt 202.0 lb

## 2023-06-17 DIAGNOSIS — M7711 Lateral epicondylitis, right elbow: Secondary | ICD-10-CM

## 2023-06-17 DIAGNOSIS — S92125K Nondisplaced fracture of body of left talus, subsequent encounter for fracture with nonunion: Secondary | ICD-10-CM

## 2023-06-17 NOTE — Progress Notes (Signed)
DATE OF VISIT: 06/17/2023        Isaac Kelly DOB: 03/14/1966 MRN: [id number]  CC:  Lt ankle pain and Rt elbow pain  History- Isaac Kelly is a 57 y.o.  male for evaluation and treatment of Lt ankle pain Referred by [facility] clinic - last seen 06/14/23 Works as Surveyor, minerals Lt ankle pain since 12/19/21 - had a fall from about 4-5 feet onto the left ankle - inversion injury, was unable to bear weight after injury - seen in ER 12/20/22 - xray showing ossific fragments around the talus, but no comment of talar body fracture.  Personal review showed inferolateral talar body fracture - was referred to Ortho -- seen by [facility] for multiple visits from 01/08/22 thru 04/01/22 Antony Blackbird by Dr Nicki Guadalajara - foot & ankle surgeon at [facility] - CT foot & ankle 06/15/22 showing: non-displaced  talar body non-union - recommended conservative tx vs surgery - decided to proceed with conservative tx  Since that time having ongoing pain and swelling Has not had f/u with Dr Susa Simmonds since that time  Also c/o Rt elbow/shoulder pain RHD No injury/trauma Pain primarily along lateral elbow No radiation of pain No meds for this    Past Medical History Past Medical History:  Diagnosis Date   Diabetes mellitus without complication (HCC)    Hyperlipidemia     Past Surgical History Past Surgical History:  Procedure Laterality Date   CHOLECYSTECTOMY     LEFT HEART CATH AND CORONARY ANGIOGRAPHY N/A 10/14/2020   Procedure: LEFT HEART CATH AND CORONARY ANGIOGRAPHY;  Surgeon: Lamar Blinks, MD;  Location: [facility] INVASIVE CV LAB;  Service: Cardiovascular;  Laterality: N/A;    Medications Current Outpatient Medications  Medication Sig Dispense Refill   aspirin EC 81 MG EC tablet Take 1 tablet (81 mg total) by mouth daily. Swallow whole. 30 tablet 11   atorvastatin (LIPITOR) 80 MG tablet Take 1 tablet (80 mg total) by mouth at bedtime. 90 tablet 3   lisinopril (PRINIVIL) 10 MG tablet Take 1 tablet (10 mg total)  by mouth daily. 90 tablet 2   No current facility-administered medications for this visit.    Allergies has No Known Allergies.  Family History - reviewed per EMR and intake form  Social History   has no history on file for alcohol use.  reports that he has never smoked. He has never used smokeless tobacco.  has no history on file for drug use. OCCUPATION: Works as Armed forces logistics/support/administrative officer: Vitals: BP 126/88   Ht 5\' 7"  (1.702 m)   Wt 202 lb (91.6 kg)   BMI 31.64 kg/m  General: AOx3, NAD, pleasant SKIN: no rashes or lesions, skin clean, dry, intact MSK: FOOT/ANKLE: LT foot and ankle with soft tissue swelling along lateral ankle, no bruising or redness.  Near FROM with some pain.  Tender to palpation along the lateral talus.  No tenderness over the medial or lateral malleolus.  No tenderness throughout the midfoot.  Ankle strength 5 -/5 with pain with resisted inversion and eversion. Right foot and ankle with full range of motion without pain or weakness.  ELBOW:  RT elbow with FROM without pain.  no swelling or effusion.  (+) TTP over lateral epicondyle.  no TTP over olecranon.  no TTP over medial epicondyle.  no pain with resisted wrist flexion, mild pain with resisted wrist extension, mild pain with resisted 3rd finger extension.   LT elbow with FROM without pain or weakness.  SHOULDER: RT shoulder  without gross deformity.  FROM without pain.  no TTP over bicipital groove, AC joint, greater tuberosity.  neg Hawkins, neg Neer, neg Empty Can, neg Drop arm.  RTC strength 5/5 throughout.  LT shoulder with FROM without pain, weakness, instability    NEURO: sensation intact to light touch upper ext and lower ext bilaterally VASC: pulses 2+ and symmetric radial artery bilaterally, no edema  IMAGING: CT Lt ankle 06/16/22 showing: IMPRESSION: 1. 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 2. Multiple small ossific fragments lateral to the talar body and inferior to the lateral malleolus may be posttraumatic. 3. Underlying subtalar degenerative changes and a moderate-sized effusion. 4. No acute osseous findings. - images personally reviewed by me today and compared to Lt ankle XR from 12/19/21.  Non-union of talus fx above c/w xray findings  LT ANKLE XR AP/LAT/MORTISE 12/19/21 @ ER - Images personally reviewed & interpreted by me today showing ossific fragments along inferomedial and inferolateral aspect of the talus.  Also appears to have fracture of the inferolateral talar body  IMPRESSION: Multiple osseous fragments adjacent to the inferomedial, inferolateral and posterior aspect of the talus  Assessment & Plan Nondisplaced fracture of body of left talus, subsequent encounter for fracture with nonunion Nondisplaced fracture of the left inferior lateral aspect of the talar body status post fall 12/19/2021, CT scan of the left foot and ankle 06/15/2022 showing nonunion -Ongoing pain since that time, no improvement with conservative care, has not had follow-up with surgeon in the last year  Plan: -Reviewed imaging studies as noted above 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  Diagnosis and treatment discussed in detail.  Given ongoing symptoms and nonunion that extends into the joint space, recommend that he follows up with the surgeon -Information and phone number for Dr. Nicki Guadalajara with [facility] was provided.  He will give them a call to schedule.  He let me know if he has any issues.  He will likely need surgical intervention  Right lateral epicondylitis Right lateral elbow pain x 1 month with lateral epicondylitis  Plan: -Diagnosis and treatment discussed in detail -Recommend Voltaren gel every 6-8 hours as needed -Home exercise program provided.  Should do these on a daily basis -Recommend elbow sleeve.  Offered him body  helix in the office today, but he plans to buy his own over-the-counter.  Should wear with work and activity -4 to 6 weeks for reevaluation if having ongoing symptoms, sooner as needed  Patient expressed understanding & agreement with above.  Encounter Diagnoses  Name Primary?   Nondisplaced fracture of body of left talus, subsequent encounter for fracture with nonunion Yes   Right lateral epicondylitis     No orders of the defined types were placed in this encounter.   No orders of the defined types were placed in this encounter.

## 2023-06-17 NOTE — Assessment & Plan Note (Signed)
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  Diagnosis and treatment discussed in detail.  Given ongoing symptoms and nonunion that extends into the joint space, recommend that he follows up with the surgeon -Information and phone number for Dr. Nicki Guadalajara with [facility] was provided.  He will give them a call to schedule.  He let me know if he has any issues.  He will likely need surgical intervention

## 2023-06-17 NOTE — Patient Instructions (Addendum)
It was a pleasure meeting you today.  For your Left ankle: As you know, you have a non-healed fracture of a bone in your ankle called the talus. Since you are having ongoing pain, I recommend you follow-up with the Food & Ankle Surgeon you saw at [facility] - Dr Dub Mikes - you can call their office at phone# [phone] to schedule a follow-up visit for your left ankle - here is his webpage if needed: [email or web address]  ---------------------------------------- For your Rt elbow/arm pain: You have lateral epicondylitis (aka: Tennis elbow) - Try to avoid painful activities as much as possible. - Ice the area 3-4 times a day for 15 minutes at a time as needed - You can continue use the Voltaren gel applied 3-4 times a day as needed to help with pain and inflammation - You cal also use Tylenol as needed for pain - you should wear the elbow sleeve with work and activity - Do home exercises as directed. - you should follow-up with me in 6 weeks if it is not improving

## 2023-06-27 DIAGNOSIS — Z419 Encounter for procedure for purposes other than remedying health state, unspecified: Secondary | ICD-10-CM | POA: Diagnosis not present

## 2023-07-28 DIAGNOSIS — Z419 Encounter for procedure for purposes other than remedying health state, unspecified: Secondary | ICD-10-CM | POA: Diagnosis not present

## 2023-08-28 DIAGNOSIS — Z419 Encounter for procedure for purposes other than remedying health state, unspecified: Secondary | ICD-10-CM | POA: Diagnosis not present

## 2023-09-25 DIAGNOSIS — Z419 Encounter for procedure for purposes other than remedying health state, unspecified: Secondary | ICD-10-CM | POA: Diagnosis not present

## 2023-10-10 IMAGING — CT CT CERVICAL SPINE W/O CM
3 series · 11 of 33 positions shown, 13 images · non-contrast
Comparison: None.

CLINICAL DATA: Provided history: Head trauma, moderate/severe. Neck
pain, acute, no red flags.



[Series 4: c_spine 2.0 st · axial · 0.32mm/px · z∈[-206,-84]mm · 3 of 101 slices shown, 4 images]
[im 24/101  soft-tissue]
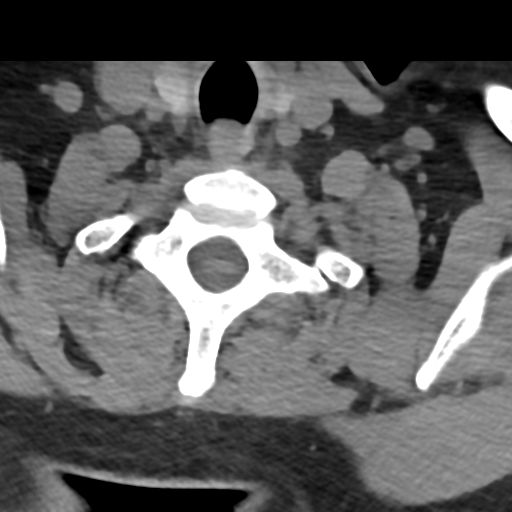
[im 24/101  bone]
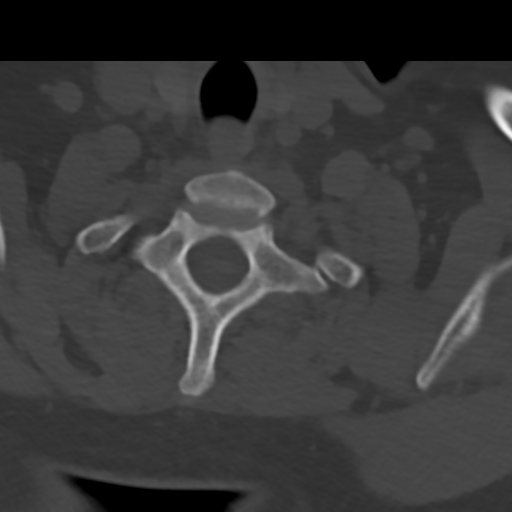
[im 54/101  bone]
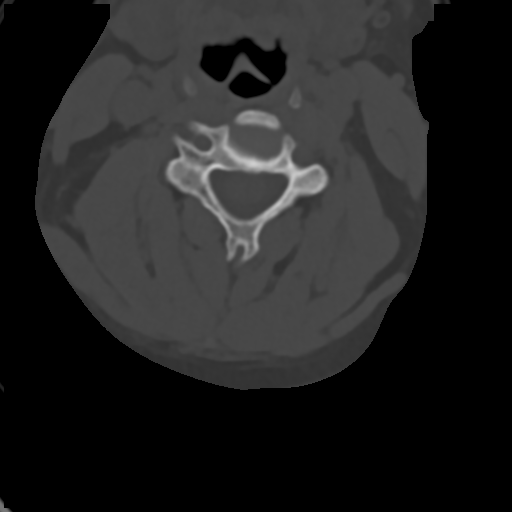
[im 85/101  bone]
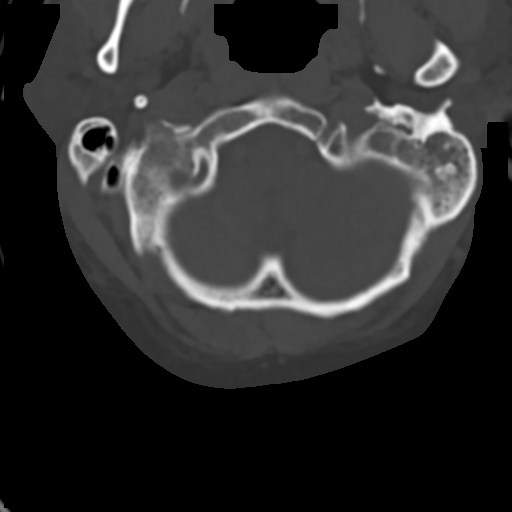

[Series 8: c_spine 2.0 sag bone · sagittal · 0.29mm/px · 5 of 61 slices shown, 6 images]
[im 21/61  bone]
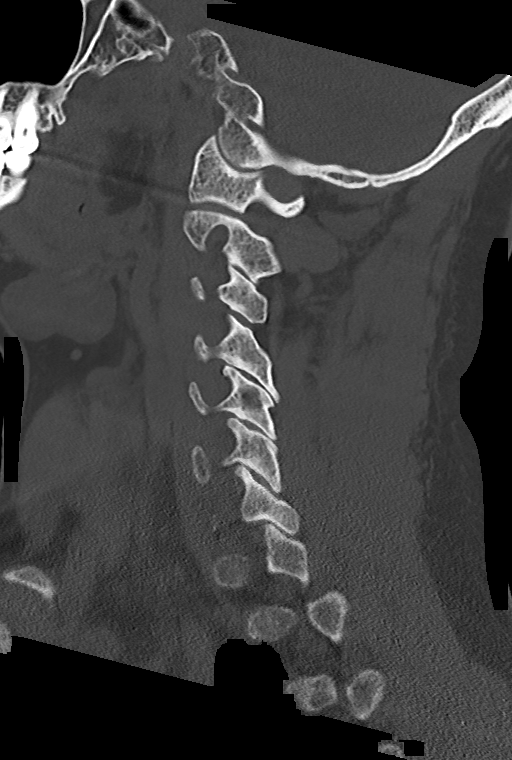
[im 26/61  bone]
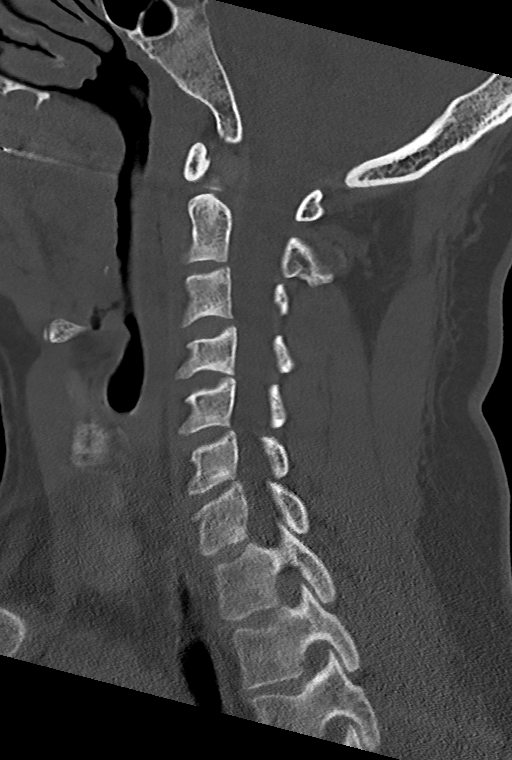
[im 31/61  soft-tissue]
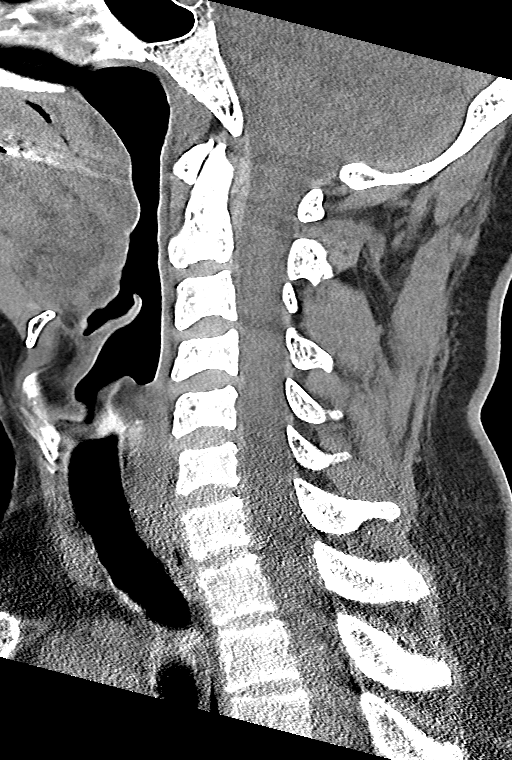
[im 31/61  bone]
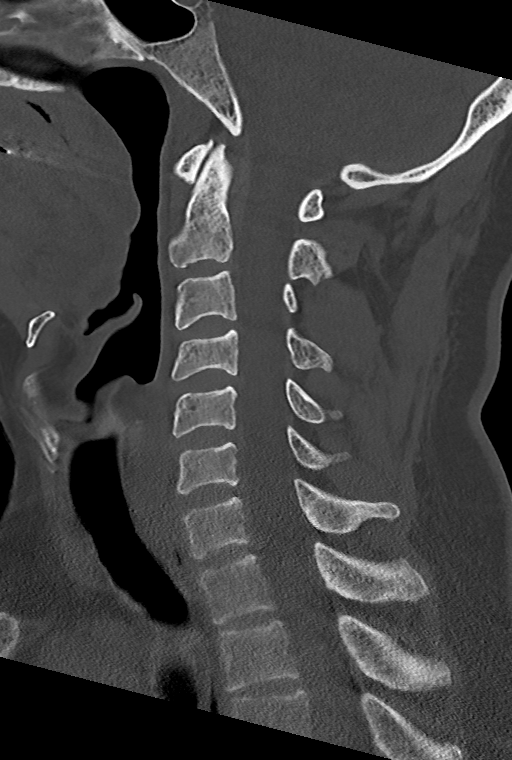
[im 36/61  bone]
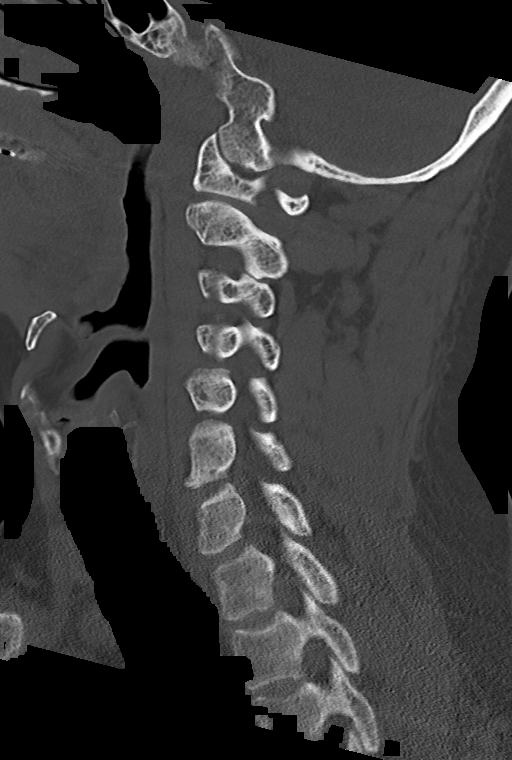
[im 41/61  bone]
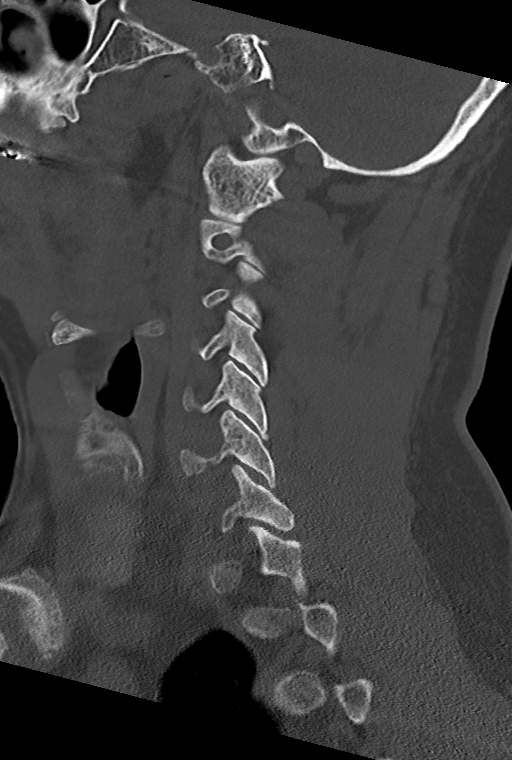

[Series 9: c_spine 2.0 cor bone · coronal · 0.23mm/px · 3 of 76 slices shown]
[im 16/76  bone]
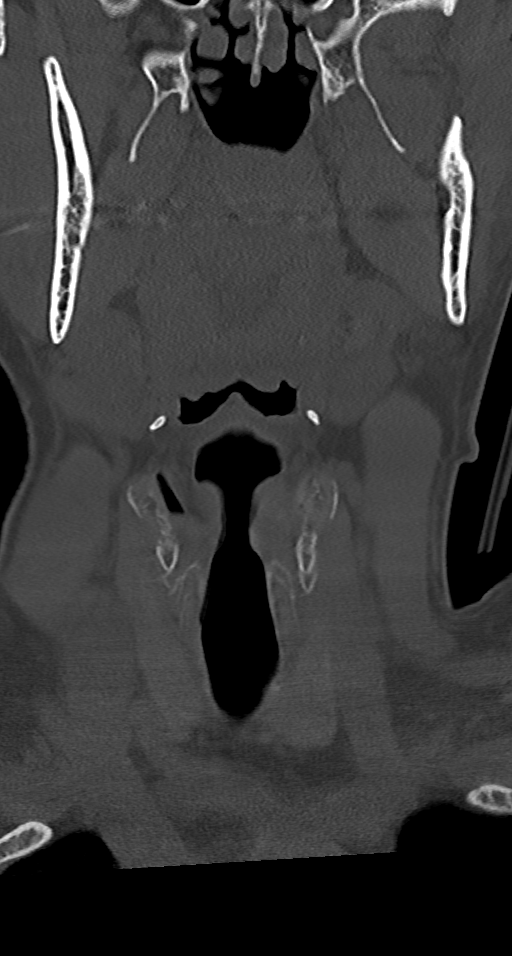
[im 31/76  bone]
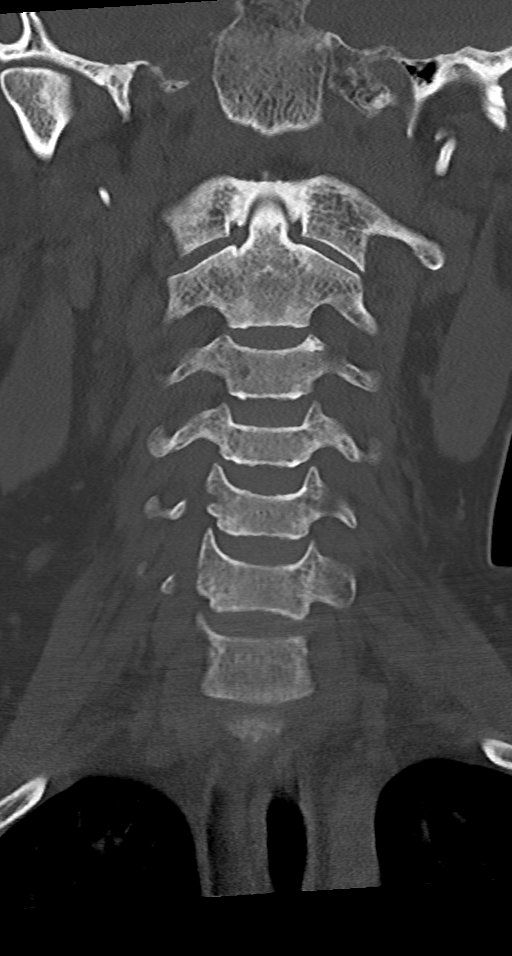
[im 46/76  bone]
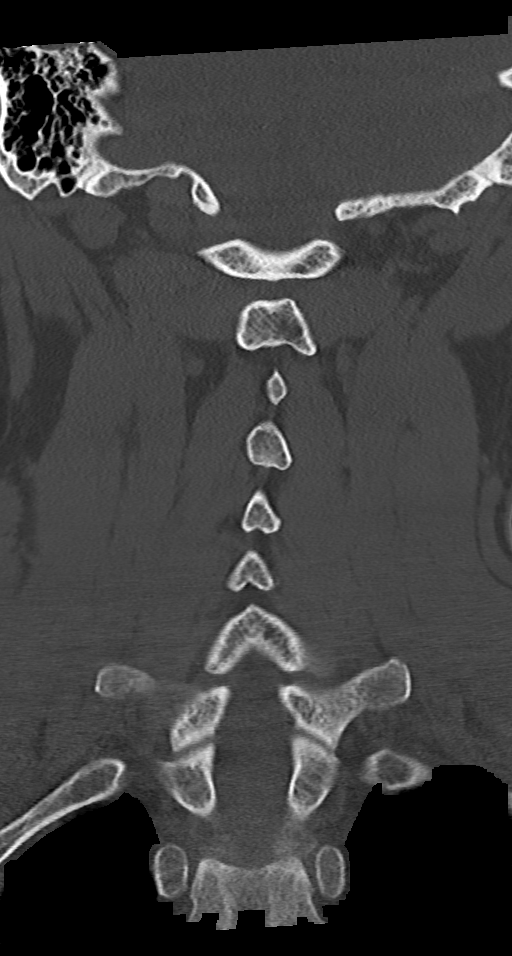

[11 of 33 positions shown; findings below may reference images not displayed]

FINDINGS: CT HEAD FINDINGS

Brain:

Cerebral volume is normal.

Nonspecific 3 mm parenchymal calcification within the right
temporoparietal junction (series 3, image 17).

There is no acute intracranial hemorrhage.

No demarcated cortical infarct.

No extra-axial fluid collection.

No evidence of an intracranial mass.

No midline shift.

Vascular: No hyperdense vessel. Atherosclerotic calcifications.

Skull: No fracture or aggressive osseous lesion.

Sinuses/Orbits: No mass or acute finding within the imaged orbits.
No significant paranasal sinus disease.

Other: Subcentimeter right parietal scalp lipoma.

CT CERVICAL SPINE FINDINGS

Alignment: Straightening of the expected cervical lordosis. No
significant spondylolisthesis.

Skull base and vertebrae: The basion-dental and atlanto-dental
intervals are maintained.No evidence of acute fracture to the
cervical spine.

Soft tissues and spinal canal: No prevertebral fluid or swelling. No
visible canal hematoma.

Disc levels: The intervertebral disc spaces are largely preserved.
Shallow multilevel disc bulges. Levels with mild facet spurring. No
appreciable high-grade spinal canal stenosis. No compressive bony
neural foraminal narrowing.

Upper chest: No consolidation within the imaged lung apices. No
visible pneumothorax.
IMPRESSION: CT head:

No evidence of acute intracranial abnormality.

CT cervical spine:

1. No evidence of acute fracture to the cervical spine.
2. Nonspecific straightening of the expected cervical lordosis.
3. Cervical spondylosis, as described.

## 2023-10-10 IMAGING — CT CT HEAD W/O CM
4 series · 15 of 47 positions shown, 17 images · non-contrast
Comparison: None.

CLINICAL DATA: Provided history: Head trauma, moderate/severe. Neck
pain, acute, no red flags.



[Series 3: head without · axial · non-contrast · 0.42mm/px · z∈[-84,+36]mm · 7 of 34 slices shown, 9 images]
[im 5/34  brain]
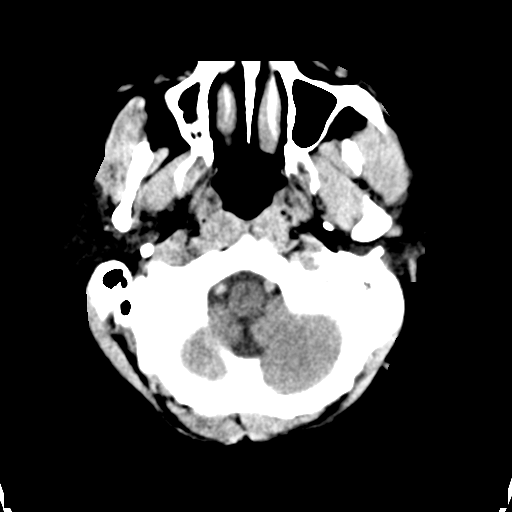
[im 5/34  bone]
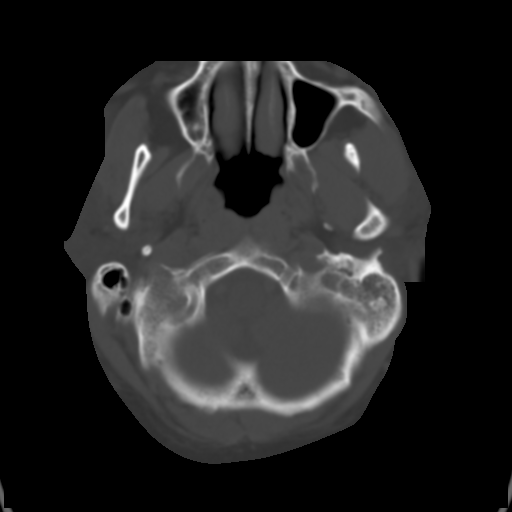
[im 9/34  brain]
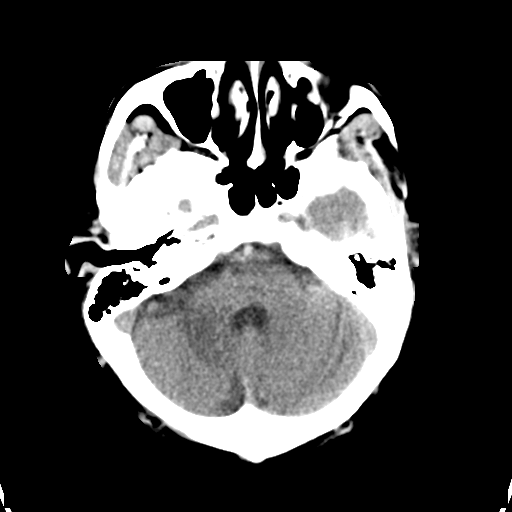
[im 13/34  brain]
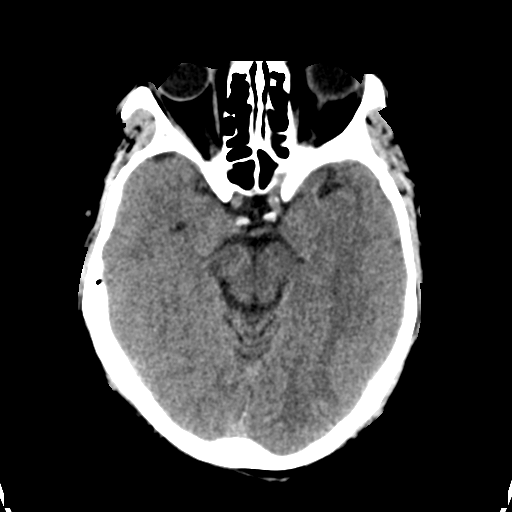
[im 17/34  brain]
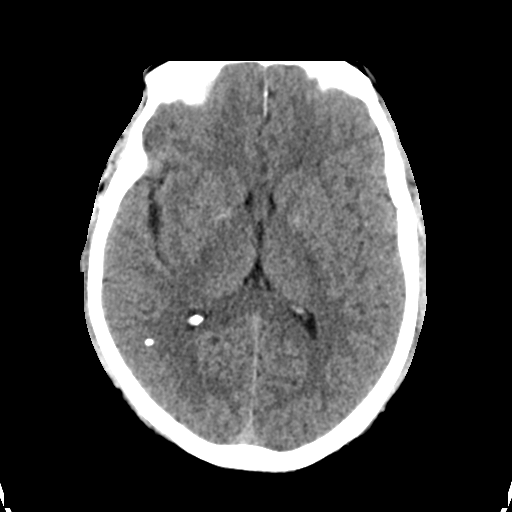
[im 21/34  brain]
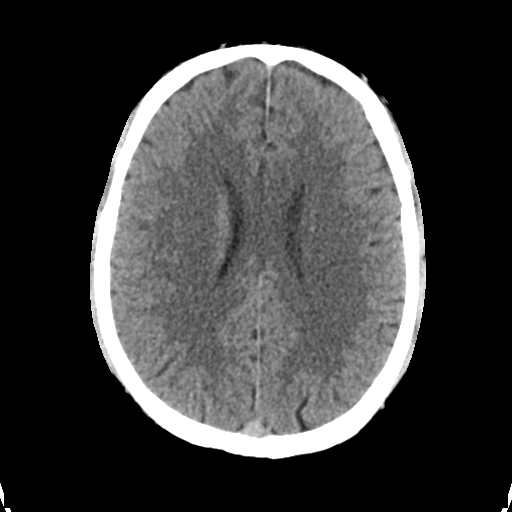
[im 21/34  bone]
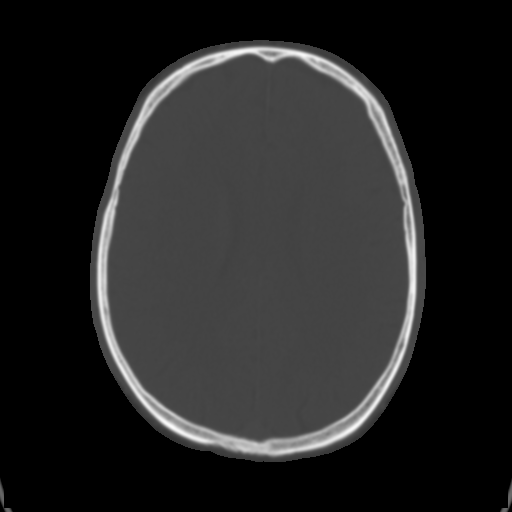
[im 25/34  brain]
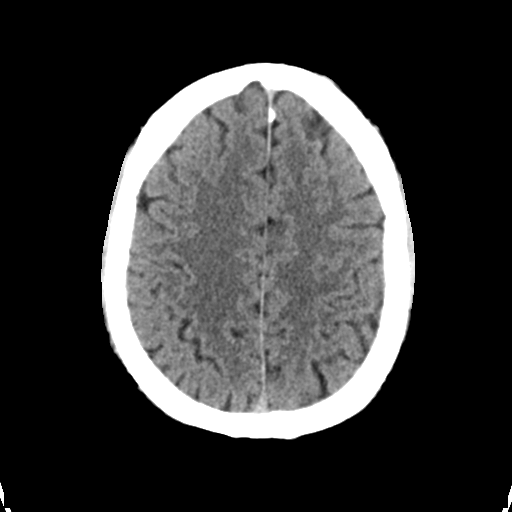
[im 29/34  brain]
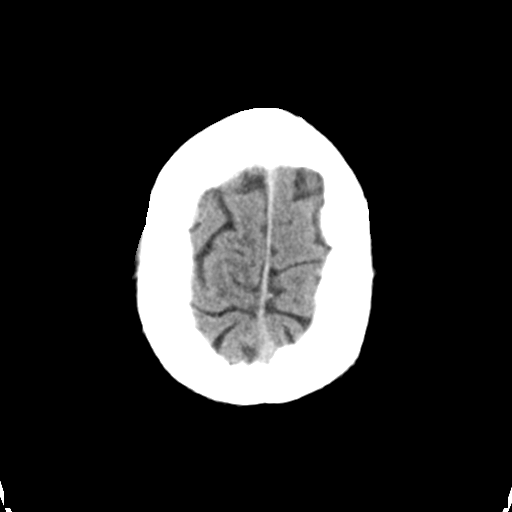

[Series 4: head bone · axial · 0.42mm/px · z∈[-88,-72]mm · 2 of 84 slices shown]
[im 9/84  bone]
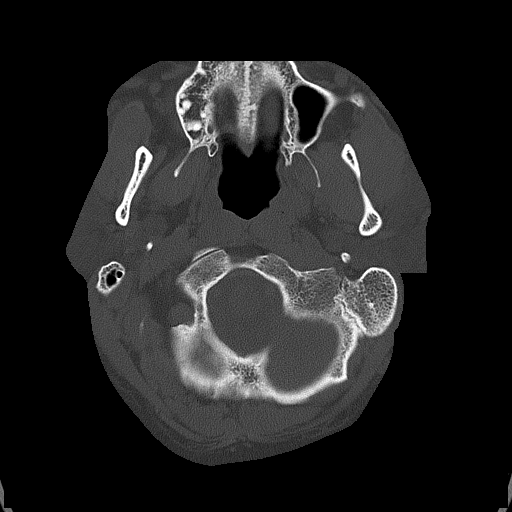
[im 17/84  bone]
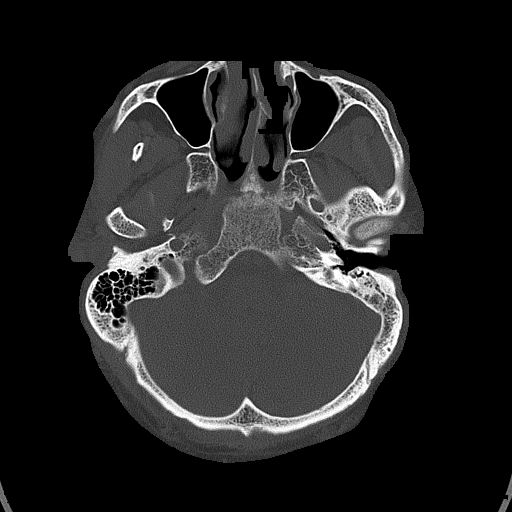

[Series 5: head without cor · coronal · non-contrast · 0.33mm/px · 3 of 69 slices shown]
[im 23/69  brain]
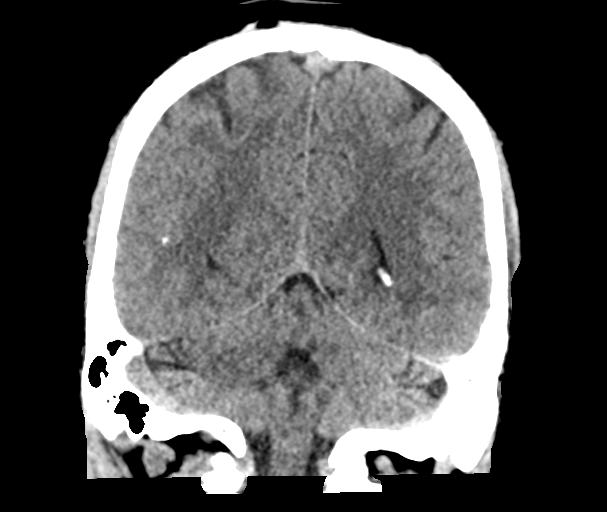
[im 31/69  brain]
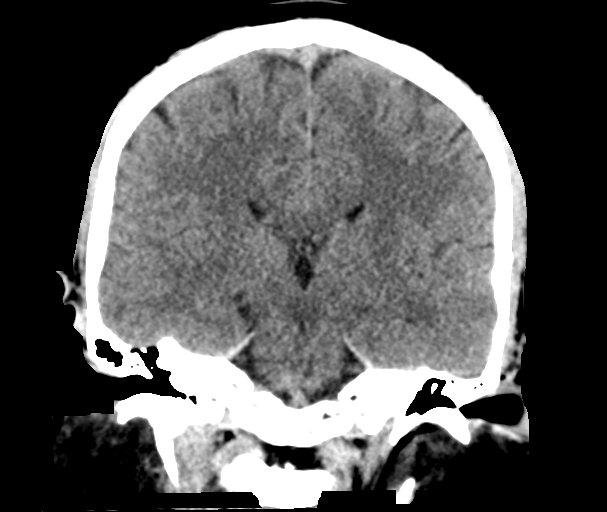
[im 38/69  brain]
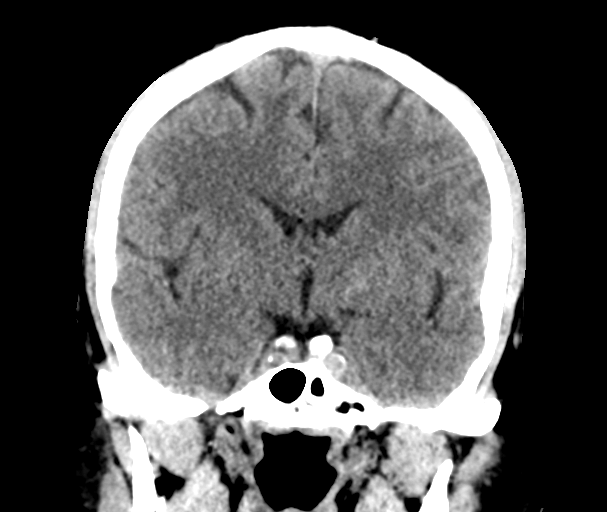

[Series 6: head without sag · sagittal · non-contrast · 0.33mm/px · 3 of 67 slices shown]
[im 23/67  brain]
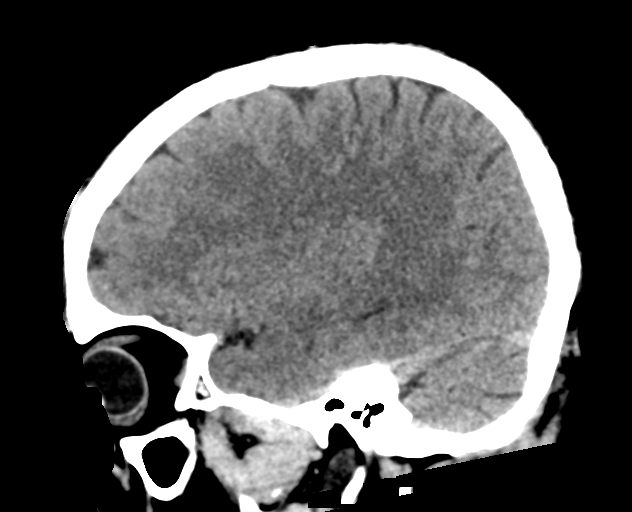
[im 34/67  brain]
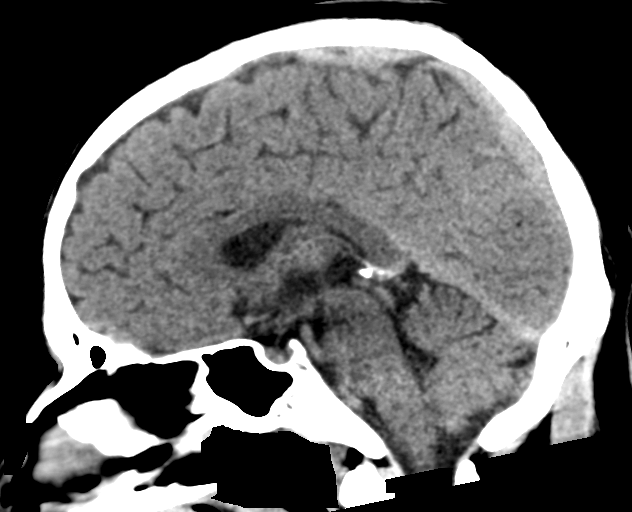
[im 45/67  brain]
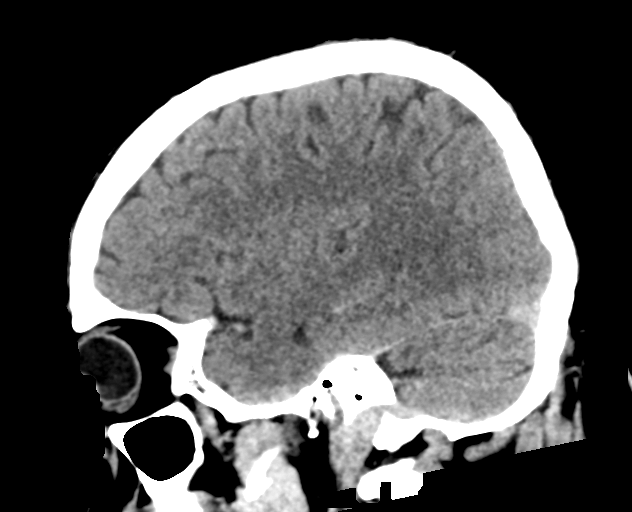

[15 of 47 positions shown; findings below may reference images not displayed]

FINDINGS: CT HEAD FINDINGS

Brain:

Cerebral volume is normal.

Nonspecific 3 mm parenchymal calcification within the right
temporoparietal junction (series 3, image 17).

There is no acute intracranial hemorrhage.

No demarcated cortical infarct.

No extra-axial fluid collection.

No evidence of an intracranial mass.

No midline shift.

Vascular: No hyperdense vessel. Atherosclerotic calcifications.

Skull: No fracture or aggressive osseous lesion.

Sinuses/Orbits: No mass or acute finding within the imaged orbits.
No significant paranasal sinus disease.

Other: Subcentimeter right parietal scalp lipoma.

CT CERVICAL SPINE FINDINGS

Alignment: Straightening of the expected cervical lordosis. No
significant spondylolisthesis.

Skull base and vertebrae: The basion-dental and atlanto-dental
intervals are maintained.No evidence of acute fracture to the
cervical spine.

Soft tissues and spinal canal: No prevertebral fluid or swelling. No
visible canal hematoma.

Disc levels: The intervertebral disc spaces are largely preserved.
Shallow multilevel disc bulges. Levels with mild facet spurring. No
appreciable high-grade spinal canal stenosis. No compressive bony
neural foraminal narrowing.

Upper chest: No consolidation within the imaged lung apices. No
visible pneumothorax.
IMPRESSION: CT head:

No evidence of acute intracranial abnormality.

CT cervical spine:

1. No evidence of acute fracture to the cervical spine.
2. Nonspecific straightening of the expected cervical lordosis.
3. Cervical spondylosis, as described.

## 2023-11-04 DIAGNOSIS — S62632A Displaced fracture of distal phalanx of right middle finger, initial encounter for closed fracture: Secondary | ICD-10-CM | POA: Diagnosis not present

## 2023-11-04 DIAGNOSIS — Z87891 Personal history of nicotine dependence: Secondary | ICD-10-CM | POA: Diagnosis not present

## 2023-11-04 DIAGNOSIS — S62662A Nondisplaced fracture of distal phalanx of right middle finger, initial encounter for closed fracture: Secondary | ICD-10-CM | POA: Diagnosis not present

## 2023-11-04 DIAGNOSIS — S62634A Displaced fracture of distal phalanx of right ring finger, initial encounter for closed fracture: Secondary | ICD-10-CM | POA: Diagnosis not present

## 2023-11-04 DIAGNOSIS — S61312A Laceration without foreign body of right middle finger with damage to nail, initial encounter: Secondary | ICD-10-CM | POA: Diagnosis not present

## 2023-11-04 DIAGNOSIS — W2209XA Striking against other stationary object, initial encounter: Secondary | ICD-10-CM | POA: Diagnosis not present

## 2023-11-04 DIAGNOSIS — W230XXA Caught, crushed, jammed, or pinched between moving objects, initial encounter: Secondary | ICD-10-CM | POA: Diagnosis not present

## 2023-11-04 DIAGNOSIS — Y929 Unspecified place or not applicable: Secondary | ICD-10-CM | POA: Diagnosis not present

## 2023-11-04 DIAGNOSIS — S6721XA Crushing injury of right hand, initial encounter: Secondary | ICD-10-CM | POA: Diagnosis not present

## 2023-11-04 DIAGNOSIS — S6991XA Unspecified injury of right wrist, hand and finger(s), initial encounter: Secondary | ICD-10-CM | POA: Diagnosis not present

## 2023-11-06 DIAGNOSIS — Z419 Encounter for procedure for purposes other than remedying health state, unspecified: Secondary | ICD-10-CM | POA: Diagnosis not present

## 2023-11-11 DIAGNOSIS — S62632D Displaced fracture of distal phalanx of right middle finger, subsequent encounter for fracture with routine healing: Secondary | ICD-10-CM | POA: Diagnosis not present

## 2023-11-11 DIAGNOSIS — L988 Other specified disorders of the skin and subcutaneous tissue: Secondary | ICD-10-CM | POA: Diagnosis not present

## 2023-11-11 DIAGNOSIS — S6710XA Crushing injury of unspecified finger(s), initial encounter: Secondary | ICD-10-CM | POA: Diagnosis not present

## 2023-11-11 DIAGNOSIS — S62634D Displaced fracture of distal phalanx of right ring finger, subsequent encounter for fracture with routine healing: Secondary | ICD-10-CM | POA: Diagnosis not present

## 2023-12-06 DIAGNOSIS — Z419 Encounter for procedure for purposes other than remedying health state, unspecified: Secondary | ICD-10-CM | POA: Diagnosis not present

## 2023-12-14 NOTE — Progress Notes (Signed)
 12/16/2023  Chief Complaint Follow-up (Right long and ring finger comminuted distal phalanx fractures s/p bedside laceration repair, nail bed splinting, and subungual hematoma drainage on 11/04/23. )  History of Present Illness  His right hand, specifically the long and ring fingers, feels improved and okay overall. He mentions a sensation of the bone being close to the skin in the distal phalanx of the ring finger but has no pain in the long finger. No numbness or tingling in the fingers is present.  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 No hypersensitivity or tingling in the fingertips.    Problem List Patient Active Problem List  Diagnosis  . Coronary artery disease involving native coronary artery of native heart  . Hyperlipidemia, mixed  . Benign essential HTN  . Open displaced fracture of distal phalanx of right ring finger with routine healing  . Open displaced fracture of distal phalanx of right middle finger with routine healing  . Maceration of skin  . Crushing injury of finger of right hand  . Nail, injury by    Medications prior to this visit: Current Outpatient Medications  Medication Sig Dispense Refill  . aspirin  81 MG EC tablet Take 1 tablet by mouth once daily    . atorvastatin  (LIPITOR) 80 MG tablet take 1 tablet by mouth at bedtime 30 tablet 0  . lisinopriL  (ZESTRIL ) 10 MG tablet Take 1 tablet (10 mg total) by mouth once daily 90 tablet 4   No current facility-administered medications for this visit.   No Known Allergies  Past Medical History: Past Medical History:  Diagnosis Date  . History of myocardial infarction 10/13/2020  . Hyperlipidemia    Past Surgical History:  Procedure Laterality Date  . REPAIR HIATAL HERNIA  2005     Social History: Social History   Social History Narrative  . Not on file    Social History   Socioeconomic History  . Marital status: Married  Tobacco Use  . Smoking status: Former    Current packs/day: 0.00    Types: Cigarettes    Quit date: 10/23/1980    Years since quitting: 43.1  . Smokeless tobacco: Never  Vaping Use  . Vaping status: Never Used  Substance and Sexual Activity  . Alcohol use: Yes    Alcohol/week: 1.0 standard drink of alcohol    Types: 1 Glasses of wine per week    Comment: daily   . Drug use: Never   Social Drivers of Health    Received from [employer]   Social Network  Housing Stability: Unknown (12/16/2023)   Housing Stability Vital Sign   . Homeless in the Last Year: No    Family History: Family History  Problem Relation Age of Onset  . Diabetes Mother   . High blood pressure (Hypertension) Mother   . Hyperlipidemia (Elevated cholesterol) Mother   . Hyperlipidemia (Elevated cholesterol) Father   . High blood pressure (Hypertension) Father   . Diabetes Father   . Diabetes Brother   . Diabetes Maternal Grandmother   . Diabetes Paternal Grandmother   . Diabetes Brother   . Diabetes Brother      The following portions of the patient's history were reviewed and updated as appropriate: allergies, current medications, past family history, past medical history, past social history, past surgical history and problem list.  Objective:   Physical  Exam:  BP (!) 149/91 (BP Location: Left upper arm, Patient Position: Sitting) Comment: Patient is asymptomatic. Around baseline per patient.  Pulse 59   Temp 36.8 C (98.3 F) (Oral)   Resp 17   Constitutional: Appears well-developed and well-nourished. No distress.  Skin: Skin is warm and dry to palpation. No rashes on inspection.   Inspection Right Left  Deformity No obvious deformity  No obvious deformity   General Appearance No edema, atrophy, dystrophic or dysvascular changes No edema, atrophy, dystrophic or dysvascular changes  Swelling     Skin No obvious  lesions, or open wounds No obvious lesions, or open wounds   Palpation Right    Hand tenderness Non-tender   Thumb tenderness Non-tender   Finger tenderness Non-tender   Crepitus Absent   Fluctuance Absent    Provocative Testing Right Left  Phalen's sign []   []    Elbow Hyperflexion []   []    Tinel's sign []   []     Vascular Exam Right Left  Capillary refill Within normal limits, <2 seconds Within normal limits, <2 seconds    Physical Exam MUSCULOSKELETAL: R hand, there is a 1 cm from fingertips to palm on active fist formation. New nails are growing and skin is without maceration. No tenderness in the long finger. Tenderness is present in the ring finger at the distal phalanx. AROM of DIPJ of ring and long fingers NEUROLOGICAL: Sensory examination is grossly normal.    Laboratory Results for orders placed or performed during the hospital encounter of 11/04/23  X-ray hand right minimum 3 views   Narrative   XR HAND RIGHT MINIMUM 3 VIEWS Number of views: 3 views.  Indication:  crush injury, S67.21XA Crushing injury of right hand, initial encounter.  Comparison: None.  Findings/Impression:  Acute comminuted nondisplaced extra-articular fracture of the third digit distal phalanx tuft.  Acute comminuted mildly displaced extra-articular fracture of the fourth digit distal tuft. Volar displacement of the distal fragment.  Mild scattered osteoarthritic degenerative changes.  Soft tissue laceration of the distal fourth digit.   Electronically Signed by:  Arvil Agent, MD, [facility] Electronically Signed on:  11/04/2023 12:11 PM  Complete Blood Count (CBC) with Differential  Result Value Ref Range   WBC (White Blood Cell Count) 10.5 (H) 3.2 - 9.8 x10^9/L   Hemoglobin 15.1 13.7 - 17.3 g/dL   Hematocrit 54.3 60.9 - 49.0 %   Platelets 237 150 - 450 x10^9/L   MCV (Mean Corpuscular Volume) 90 80 - 98 fL   MCH (Mean Corpuscular Hemoglobin) 29.9 26.5 - 34.0 pg   MCHC (Mean  Corpuscular Hemoglobin Concentration) 33.1 31.5 - 36.3 %   RBC (Red Blood Cell Count) 5.05 4.37 - 5.74 x10^12/L   RDW-CV (Red Cell Distribution Width) 13.0 11.5 - 14.5 %   NRBC (Nucleated Red Blood Cell Count) 0.00 0 x10^9/L   NRBC % (Nucleated Red Blood Cell %) 0.0 %   MPV (Mean Platelet Volume) 10.3 7.2 - 11.7 fL   Neutrophil Count 5.8 2.0 - 8.6 x10^9/L   Neutrophil % 54.9 37 - 80 %   Lymphocyte Count 3.7 0.6 - 4.2 x10^9/L   Lymphocyte % 35.2 10 - 50 %   Monocyte Count 0.8 0 - 0.9 x10^9/L   Monocyte % 7.7 0 - 12 %   Eosinophil Count 0.14 0 - 0.70 x10^9/L   Eosinophil % 1.3 0 - 7 %   Basophil Count 0.05 0 - 0.20 x10^9/L   Basophil % 0.5 0 - 2 %  Immature Granulocyte Count 0.04 <=0.06 x10^9/L   Immature Granulocyte % 0.4 <=0.7 %  Basic Metabolic Panel (BMP)  Result Value Ref Range   Sodium 139 135 - 145 mmol/L   Potassium 4.2 3.5 - 5.0 mmol/L   Chloride 107 98 - 108 mmol/L   Carbon Dioxide (CO2) 23 21 - 30 mmol/L   Urea Nitrogen (BUN) 20 7 - 20 mg/dL   Creatinine 0.8 0.6 - 1.3 mg/dL   Glucose 93 70 - 859 mg/dL   Calcium  9.0 8.7 - 10.2 mg/dL   Anion Gap 9 3 - 12 mmol/L   BUN/CREA Ratio 26 6 - 27   Glomerular Filtration Rate (eGFR)  103 mL/min/1.73sq m  Activated Partial Thromboplastin Time (APTT)  Result Value Ref Range   Act Partial Thromboplastin Time 29.6 26.8 - 37.1 sec  Prothrombin Time (INR)  Result Value Ref Range   Prothrombin Time 9.8 9.5 - 13.1 sec   Prothrombin INR 0.8 (L) 0.9 - 1.1  Consult to Hand Surgery   Narrative   Edmund Adelita Potter, MD     11/04/2023  6:26 PM [facility] Consult Note   Date of Consult:  11/04/2023  4:19 PM  Service Requesting Consult: Emergency Medicine  Referring Provider: Rosemarie Elouise Heinz, DO  Reason for Consultation: right sided third and fourth digit  fractures   SUBJECTIVE:  History of Present Illness: Geral Coker is a 58 y.o. RIGHT  hand dominant male with a PMHx of HTN and HLD who sustained an  injury to right  hand after closing it in the door of his truck.  He owns a Architect.  In ED, VSS. Right hand XR with comminuted fractures of distal  phalanx of long and ring fingers.  Plastic Surgery is consulted for fractures of right long and ring  fingers.   Patient Active Problem List  Diagnosis   Coronary artery disease involving native coronary artery of  native heart   Hyperlipidemia, mixed   Benign essential HTN    Past Medical History:  Diagnosis Date   History of myocardial infarction 10/13/2020   Hyperlipidemia    Past Surgical History:  Procedure Laterality Date   REPAIR HIATAL HERNIA  2005   Family History  Problem Relation Name Age of Onset   Diabetes Mother     High blood pressure (Hypertension) Mother     Hyperlipidemia (Elevated cholesterol) Mother     Hyperlipidemia (Elevated cholesterol) Father     High blood pressure (Hypertension) Father     Diabetes Father     Diabetes Brother     Diabetes Maternal Grandmother     Diabetes Paternal Grandmother     Diabetes Brother     Diabetes Brother     Social History   Tobacco Use   Smoking status: Former    Current packs/day: 0.00    Types: Cigarettes    Quit date: 10/23/1980    Years since quitting: 43.0   Smokeless tobacco: Never  Substance Use Topics   Alcohol use: Yes    Alcohol/week: 1.0 standard drink of alcohol    Types: 1 Glasses of wine per week    Comment: daily      No Known Allergies   Medications:  Prior to Admission medications   Medication Sig Taking? Last Dose  aspirin  81 MG EC tablet Take 1 tablet by mouth once daily    atorvastatin  (LIPITOR) 80 MG tablet take 1 tablet by mouth at  bedtime    lisinopriL  (ZESTRIL ) 10 MG  tablet Take 1 tablet (10 mg total) by  mouth once daily       HYDROmorphone   2 mg Oral Once    Review of Systems:  Pertinent items are noted in HPI.   OBJECTIVE:  Physical Exam: Temp:  [36.7 C (98 F)] 36.7 C (98 F) Heart Rate:  [91] 91 Resp:  [19]  19 BP: (131)/(82) 131/82  Temp (24hrs), Avg:36.7 C (98 F), Min:36.7 C (98 F), Max:36.7  C (98 F)  Weight: 91.2 kg (201 lb) No intake/output data recorded.  General: Alert, cooperative  Cardiovascular: HDS Respiratory: Normal work of breathing on room air  RIGHT Upper Extremity: Ring finger: Laceration over dorsal and lateral surface at DIPJ.  Nail plate displaced. Transverse laceration of nailbed. Volar  surface intact.  Long finger: Hematoma of volar surface of tip of finger.  Subungual hematoma <50% width of nailbed.   Motor: PROM/AROM of fingers limited by pain but grossly intact. Sensation: Grossly intact Vascular: Dopplerable signals at distal tips.     PROCEDURE: I&D + nail plate removal with nail bed repair of RIGHT  long and ring fingers  INDICATION(S): Sahmir Weatherbee is a 58 y.o. male with injury to  the digit listed above. Decision was made to proceed with  washout, nailplate removal, and reapproximation of soft tissue.   PROCEDURAL DETAILS:  A timeout was performed confirming the correct patient, site, and  procedure. The field was cleaned with betadine and peroxide.  Digital block anesthesia was achieved using a combination of  lidocaine and bupivicaine. Adequate time was allowed to transpire  anesthesia to take effect.  A freer was used to bluntly dissect and elevate the nail plate  from the matrix and free skin from adjacent cuticle and  periungual skin. The removed nail plate was placed in a solution  of peroxide for sterilization. Peroxide, betadine, and then  sterile saline was used to to thoroughly irrigate the wound. The  laceration of the ring finger was repaired using interrupted 4-0  chromic sutures in a single layer. The volar hematoma of the long  finger was drained using a stab incision of a scalpel.  The soft tissues of the nailbeds were reapproximated using skin  glue. Next, the original nail plates were used to splint the  eponychial  folds of the respective fingers.   The wound was dressed with bacitracin ointment and Tegaderm.   A customized splint was applied to each finger and wrapped with  Klingroll.   The patient tolerated the procedure well. All instrument counts  were correct.      ASSESSMENT:  Athel Merriweather is a 58 y.o. male who sustained comminuted  fractures of the distal phalanx of the right long  and ring  finger, lacerations of the dorsal ring finger with displacement  of the nail bed, subungual hematoma and volar hematoma of long  finger requiring repair. He is now s/p bedside laceration repair,  nail bed splinting, and hematoma drainage. Alumafoam splints were  applied to the fingers.   RECOMMENDATIONS:  - Maintain splints on long and ring fingers - Wound care: remove splints and apply ointment to  nailbed/incisions daily - Recommend updated Tdap - Keflex 7 days - Will arrange follow up in hand clinic   Thank you for allowing us  to be a part of this patient's care.  Please do not hesitate to contact the Hand consult pager at  [phone] or the Hand floor pager at [phone] with any questions  or concerns. The clinical  plan is pending final approval by the  attending surgeon, Dr. Alverta.   Adelita Pyo, MD Division of Plastic, Maxillofacial, and Oral Surgery     Results RADIOLOGY Right hand X-ray: No abnormalities detected     Assessment and Plan:   Diagnoses and all orders for this visit:  Open displaced fracture of distal phalanx of right middle finger with routine healing, subsequent encounter  Open displaced fracture of distal phalanx of right ring finger with routine healing, subsequent encounter  Crushing injury of finger of right hand  Injury by nail, initial encounter     ICD-10-CM  1. Open displaced fracture of distal phalanx of right middle finger with routine healing, subsequent encounter  S62.632D  2. Open displaced fracture of distal phalanx of right  ring finger with routine healing, subsequent encounter  S62.634D  3. Crushing injury of finger of right hand  S67.10XA  4. Injury by nail, initial encounter  W45.0XXA    Assessment & Plan Fracture of distal phalanx of right ring finger The fracture is healing well with some tenderness at the distal phalanx due to ps non-union, no signs of infection., healing well. The new nail is growing, skin without maceration. He reports no pain, numbness, or tingling, and sensory function is grossly normal. - Discontinue use of splints. - Advise soap and water for cleaning, continue observation, no surgery at this time  Fracture of right long finger The fracture is healing well with no tenderness, pain, numbness, or tingling. Sensory function is grossly normal, and there are no signs of infection. The new nail is growing and no skin maceration. - Discontinue use of splints. No surgery indication at this time - continue observation  Resolved maceration of right ring and long fingers Maceration has resolved with no signs of infection, and new nails are growing well.  no additional treatment needed.  He expressed understanding and agrees with the plan. All questions were answered.      Additional Plan:  No follow-ups on file.       Current Outpatient Medications  Medication Sig Dispense Refill  . aspirin  81 MG EC tablet Take 1 tablet by mouth once daily    . atorvastatin  (LIPITOR) 80 MG tablet take 1 tablet by mouth at bedtime 30 tablet 0  . lisinopriL  (ZESTRIL ) 10 MG tablet Take 1 tablet (10 mg total) by mouth once daily 90 tablet 4   No current facility-administered medications for this visit.   Requested Prescriptions    No prescriptions requested or ordered in this encounter       This plan was discussed with the patient and questions were answered. There were no further concerns.  Follow up as indicated, or sooner should any new problems arise, if conditions worsen, or if they are  otherwise concerned.  Risks, benefits, alternatives and complications were fully explained to the patient.

## 2023-12-16 DIAGNOSIS — M2560 Stiffness of unspecified joint, not elsewhere classified: Secondary | ICD-10-CM | POA: Diagnosis not present

## 2023-12-16 DIAGNOSIS — S6710XA Crushing injury of unspecified finger(s), initial encounter: Secondary | ICD-10-CM | POA: Diagnosis not present

## 2023-12-16 DIAGNOSIS — S62632D Displaced fracture of distal phalanx of right middle finger, subsequent encounter for fracture with routine healing: Secondary | ICD-10-CM | POA: Diagnosis not present

## 2023-12-16 DIAGNOSIS — L988 Other specified disorders of the skin and subcutaneous tissue: Secondary | ICD-10-CM | POA: Diagnosis not present

## 2023-12-16 DIAGNOSIS — W208XXD Other cause of strike by thrown, projected or falling object, subsequent encounter: Secondary | ICD-10-CM | POA: Diagnosis not present

## 2023-12-16 DIAGNOSIS — S62634D Displaced fracture of distal phalanx of right ring finger, subsequent encounter for fracture with routine healing: Secondary | ICD-10-CM | POA: Diagnosis not present

## 2024-01-06 DIAGNOSIS — Z419 Encounter for procedure for purposes other than remedying health state, unspecified: Secondary | ICD-10-CM | POA: Diagnosis not present

## 2024-02-05 DIAGNOSIS — Z419 Encounter for procedure for purposes other than remedying health state, unspecified: Secondary | ICD-10-CM | POA: Diagnosis not present

## 2024-03-07 DIAGNOSIS — Z419 Encounter for procedure for purposes other than remedying health state, unspecified: Secondary | ICD-10-CM | POA: Diagnosis not present

## 2024-03-23 DIAGNOSIS — R7303 Prediabetes: Secondary | ICD-10-CM | POA: Insufficient documentation

## 2024-03-23 NOTE — Progress Notes (Unsigned)
    SUBJECTIVE:   Chief compliant/HPI: annual examination  Isaac Kelly is a 58 y.o. who presents today for an annual exam.   Concerns include: None; wants labs and med refill. Does still have left ankle pain, especially when he starts to walk, which has been ongoing since fracture of left ankle. Not interested in oral pain medications, physical therapy for this.  PMHx of: NSTEMI, HTN, prediabetes FHx significant for: No hx heart disease, cancer; positive for high cholesterol and diabetes in dad and mom  Diet: 3 meals a day, eating mostly at home, variety of foods; limiting fried/fatty foods, sweet; occasional soda; one cup of coffee a day in the morning Exercise: Walks an hour every day with work Sleep: Feels well rested in morning; getting up 2 times in a night to pee; getting around 7 hours a night; sometimes snores, no apnea Mood: Feels happy most days, no SI/HI.  Social Hx Alcohol use: Wine; one glass a day Tobacco use: None Substance use: None Relationship: Married Sexually active: Yes Job/Education: Works as a Surveyor, minerals (floors/remodeling)  Healthcare Maintenance: - HIV Screening - doing today - Hep C Screening - doing today - Pneumococcal vaccine - discussed, not interested at this time - Shingrix vaccine - discussed, will consider getting - MyChart activation - code sent today  History tabs reviewed and updated.   Review of systems form reviewed and notable for Nothing.   OBJECTIVE:   BP (!) 141/89   Pulse 70   Temp 97.9 F (36.6 C) (Oral)   Ht 5' 7 (1.702 m)   Wt 204 lb 4 oz (92.6 kg)   SpO2 99%   BMI 31.99 kg/m   General: Patient seated in chair, no acute distress. HEENT: PERRL, EOMI, conjunctiva non injected bilaterally. Nares patent bilaterally. MMM, oropharynx clear, no erythema or exudate. Neck: Supple, no lymphadenopathy. Cardiovascular: Regular rate and rhythm, no murmurs/rubs/gallops. Respiratory: Normal work of breathing on room air. Clear  to auscultation bilaterally; no wheezes, crackles. Abdomen: Bowel sounds present and normoactive bilaterally. Soft, nondistended, nontender. Ext: Pain to palpation of left lateral malleolus. 5/5 strength in bilateral lower legs, ankles; pain with dorsiflexion of left foot. No overlying skin changes. Normal sensation bilaterally. Neuro: Alert and appropriately responding to questions  ASSESSMENT/PLAN:   Assessment & Plan Encounter for annual physical exam No concerns, doing well overall. A1c of 5.6, well controlled with diet and exercise - Labs: Lipid panel, HIV, Hep C - Refilled aspirin  Chronic pain of left ankle Not interested in oral meds, physical therapy. - Prescribing Voltaren  prn  Blood pressure value is not currently at goal, but patient has not taken BP medications today. Discussed home monitoring, patient will do so over the next couple weeks and let us  know if readings are high.  Considered the following screening exams based upon USPSTF recommendations: Diabetes screening: ordered Screening for elevated cholesterol: ordered HIV testing: ordered Hepatitis C: ordered Syphilis if at high risk: Not indicated Reviewed risk factors for latent tuberculosis and not indicated Colorectal cancer screening: up to date on screening for CRC. Lung cancer screening: Not indicated PSA discussed and after engaging in discussion of possible risks, benefits and complications of screening patient elected to rediscuss in future visits.   Follow up in 1 year or sooner if indicated.  MyChart Activation: Signed up today   Alan Flies, MD [facility]

## 2024-03-24 ENCOUNTER — Ambulatory Visit (INDEPENDENT_AMBULATORY_CARE_PROVIDER_SITE_OTHER)

## 2024-03-24 VITALS — BP 141/89 | HR 70 | Temp 97.9°F | Ht 67.0 in | Wt 204.2 lb

## 2024-03-24 DIAGNOSIS — I214 Non-ST elevation (NSTEMI) myocardial infarction: Secondary | ICD-10-CM | POA: Diagnosis not present

## 2024-03-24 DIAGNOSIS — R7303 Prediabetes: Secondary | ICD-10-CM

## 2024-03-24 DIAGNOSIS — G8929 Other chronic pain: Secondary | ICD-10-CM

## 2024-03-24 DIAGNOSIS — Z0000 Encounter for general adult medical examination without abnormal findings: Secondary | ICD-10-CM | POA: Diagnosis not present

## 2024-03-24 DIAGNOSIS — M25572 Pain in left ankle and joints of left foot: Secondary | ICD-10-CM | POA: Diagnosis not present

## 2024-03-24 LAB — POCT GLYCOSYLATED HEMOGLOBIN (HGB A1C): Hemoglobin A1C: 5.6 % (ref 4.0–5.6)

## 2024-03-24 MED ORDER — DICLOFENAC SODIUM 1 % EX GEL: 2.0000 g | Freq: Four times a day (QID) | CUTANEOUS | 1 refills | Status: AC | PRN

## 2024-03-24 MED ORDER — ASPIRIN 81 MG PO TBEC: 81.0000 mg | DELAYED_RELEASE_TABLET | Freq: Every day | ORAL | 11 refills | Status: AC

## 2024-03-24 NOTE — Assessment & Plan Note (Addendum)
 Not interested in oral meds, physical therapy. - Prescribing Voltaren  prn

## 2024-03-24 NOTE — Patient Instructions (Addendum)
 Dear Isaac Kelly,   It was great seeing you in clinic today! You came in for your yearly physical. Keep up the good work with your diet and exercise!  There are a few things for you to do outside of clinic: - I am getting some labs today; the results will be sent to you once all the results are back  Thank you for allowing me to be a part of your care team! Alan Flies, MD [facility] [address] [place], [place], [place] [address] [phone]

## 2024-03-25 LAB — LIPID PANEL
Chol/HDL Ratio: 3.4 ratio (ref 0.0–5.0)
Cholesterol, Total: 163 mg/dL (ref 100–199)
HDL: 48 mg/dL (ref >39)
LDL Chol Calc (NIH): 93 mg/dL (ref 0–99)
Triglycerides: 124 mg/dL (ref 0–149)
VLDL Cholesterol Cal: 22 mg/dL (ref 5–40)

## 2024-03-25 LAB — HIV ANTIBODY (ROUTINE TESTING W REFLEX): HIV Screen 4th Generation wRfx: NONREACTIVE

## 2024-03-25 LAB — HEPATITIS C ANTIBODY: Hep C Virus Ab: NONREACTIVE

## 2024-03-28 ENCOUNTER — Ambulatory Visit (HOSPITAL_COMMUNITY): Payer: Self-pay

## 2024-06-19 ENCOUNTER — Other Ambulatory Visit: Payer: Self-pay | Admitting: Family Medicine

## 2024-06-19 DIAGNOSIS — I214 Non-ST elevation (NSTEMI) myocardial infarction: Secondary | ICD-10-CM

## 2024-07-09 ENCOUNTER — Other Ambulatory Visit: Payer: Self-pay | Admitting: Family Medicine

## 2024-07-09 DIAGNOSIS — I214 Non-ST elevation (NSTEMI) myocardial infarction: Secondary | ICD-10-CM

## 2024-07-10 ENCOUNTER — Telehealth: Payer: Self-pay

## 2024-07-10 ENCOUNTER — Other Ambulatory Visit (HOSPITAL_COMMUNITY): Payer: Self-pay

## 2024-07-10 NOTE — Telephone Encounter (Signed)
 Pharmacy Patient Advocate Encounter   Received notification from Patient Pharmacy that prior authorization for ATORVASTATIN  80MG  is required/requested.   Insurance verification completed.   The patient is insured through [facility] MEDICAID.   PA required; PA submitted to above mentioned insurance via Latent Key/confirmation #/[id number]. Status is pending

## 2024-07-11 ENCOUNTER — Other Ambulatory Visit (HOSPITAL_COMMUNITY): Payer: Self-pay

## 2024-07-12 ENCOUNTER — Other Ambulatory Visit (HOSPITAL_COMMUNITY): Payer: Self-pay

## 2024-07-17 ENCOUNTER — Other Ambulatory Visit (HOSPITAL_COMMUNITY): Payer: Self-pay

## 2024-07-17 NOTE — Telephone Encounter (Signed)
 Pharmacy Patient Advocate Encounter  Received notification from [place] MEDICA[employer] that Prior Authorization for ATORVASTATIN  has been CANCELLED due to  PATIENT HAS FAMILY PLANNING MEDICAID
